# Patient Record
Sex: Male | Born: 1975
Health system: Southern US, Community
[De-identification: ages and names within clinical notes are randomized; demographics above are authoritative.]

## PROBLEM LIST (undated history)

## (undated) DIAGNOSIS — M549 Dorsalgia, unspecified: Secondary | ICD-10-CM

---

## 2000-05-27 ENCOUNTER — Encounter: Payer: Self-pay | Admitting: Emergency Medicine

## 2000-05-27 ENCOUNTER — Emergency Department (HOSPITAL_COMMUNITY): Admission: EM | Admit: 2000-05-27 | Discharge: 2000-05-27 | Payer: Self-pay | Admitting: Emergency Medicine

## 2001-05-07 ENCOUNTER — Emergency Department (HOSPITAL_COMMUNITY): Admission: EM | Admit: 2001-05-07 | Discharge: 2001-05-07 | Payer: Self-pay | Admitting: Emergency Medicine

## 2003-03-16 ENCOUNTER — Emergency Department (HOSPITAL_COMMUNITY): Admission: EM | Admit: 2003-03-16 | Discharge: 2003-03-16 | Payer: Self-pay | Admitting: *Deleted

## 2003-12-02 ENCOUNTER — Emergency Department (HOSPITAL_COMMUNITY): Admission: EM | Admit: 2003-12-02 | Discharge: 2003-12-02 | Payer: Self-pay | Admitting: Family Medicine

## 2006-03-21 ENCOUNTER — Emergency Department (HOSPITAL_COMMUNITY): Admission: EM | Admit: 2006-03-21 | Discharge: 2006-03-21 | Payer: Self-pay | Admitting: Emergency Medicine

## 2006-09-28 ENCOUNTER — Emergency Department (HOSPITAL_COMMUNITY): Admission: EM | Admit: 2006-09-28 | Discharge: 2006-09-28 | Payer: Self-pay | Admitting: Emergency Medicine

## 2007-06-01 ENCOUNTER — Emergency Department (HOSPITAL_COMMUNITY): Admission: EM | Admit: 2007-06-01 | Discharge: 2007-06-01 | Payer: Self-pay | Admitting: Emergency Medicine

## 2007-08-14 ENCOUNTER — Emergency Department (HOSPITAL_COMMUNITY): Admission: EM | Admit: 2007-08-14 | Discharge: 2007-08-14 | Payer: Self-pay | Admitting: Emergency Medicine

## 2008-02-21 ENCOUNTER — Emergency Department (HOSPITAL_COMMUNITY): Admission: EM | Admit: 2008-02-21 | Discharge: 2008-02-21 | Payer: Self-pay | Admitting: Emergency Medicine

## 2008-05-01 ENCOUNTER — Emergency Department (HOSPITAL_COMMUNITY): Admission: EM | Admit: 2008-05-01 | Discharge: 2008-05-01 | Payer: Self-pay | Admitting: Family Medicine

## 2008-05-30 ENCOUNTER — Emergency Department (HOSPITAL_COMMUNITY): Admission: EM | Admit: 2008-05-30 | Discharge: 2008-05-30 | Payer: Self-pay | Admitting: Emergency Medicine

## 2008-10-15 ENCOUNTER — Emergency Department (HOSPITAL_COMMUNITY): Admission: EM | Admit: 2008-10-15 | Discharge: 2008-10-15 | Payer: Self-pay | Admitting: Emergency Medicine

## 2008-11-19 ENCOUNTER — Emergency Department (HOSPITAL_COMMUNITY): Admission: EM | Admit: 2008-11-19 | Discharge: 2008-11-19 | Payer: Self-pay | Admitting: Emergency Medicine

## 2009-09-06 ENCOUNTER — Emergency Department (HOSPITAL_COMMUNITY): Admission: EM | Admit: 2009-09-06 | Discharge: 2009-09-06 | Payer: Self-pay | Admitting: Emergency Medicine

## 2010-07-19 LAB — CULTURE, ROUTINE-ABSCESS

## 2014-10-16 ENCOUNTER — Encounter (HOSPITAL_COMMUNITY): Payer: Self-pay | Admitting: Emergency Medicine

## 2014-10-16 ENCOUNTER — Emergency Department (HOSPITAL_COMMUNITY)
Admission: EM | Admit: 2014-10-16 | Discharge: 2014-10-16 | Disposition: A | Discharge status: Home / Self Care | Payer: Self-pay | Attending: Emergency Medicine | Admitting: Emergency Medicine

## 2014-10-16 DIAGNOSIS — K029 Dental caries, unspecified: Secondary | ICD-10-CM | POA: Insufficient documentation

## 2014-10-16 DIAGNOSIS — K047 Periapical abscess without sinus: Secondary | ICD-10-CM | POA: Insufficient documentation

## 2014-10-16 DIAGNOSIS — Z720 Tobacco use: Secondary | ICD-10-CM | POA: Insufficient documentation

## 2014-10-16 MED ORDER — PENICILLIN V POTASSIUM 500 MG PO TABS: 500.0000 mg | ORAL_TABLET | Freq: Four times a day (QID) | ORAL | Status: DC

## 2014-10-16 MED ORDER — HYDROCODONE-ACETAMINOPHEN 5-325 MG PO TABS: 1.0000 | ORAL_TABLET | ORAL | Status: DC | PRN

## 2014-10-16 MED ORDER — HYDROCODONE-ACETAMINOPHEN 5-325 MG PO TABS
2.0000 | ORAL_TABLET | Freq: Once | ORAL | Status: AC
Start: 2014-10-16 — End: 2014-10-16
  Administered 2014-10-16: 2 via ORAL
  Filled 2014-10-16: qty 2

## 2014-10-16 MED ORDER — PENICILLIN V POTASSIUM 500 MG PO TABS
500.0000 mg | ORAL_TABLET | Freq: Once | ORAL | Status: AC
  Administered 2014-10-16: 500 mg via ORAL
  Filled 2014-10-16: qty 1

## 2014-10-16 NOTE — Discharge Instructions (Signed)
Take the prescribed medication as directed. °Follow-up with dentist.  referral and resource guide provided. °Return to the ED for new or worsening symptoms. ° ° °Emergency Department Resource Guide °1) Find a Doctor and Pay Out of Pocket °Although you won't have to find out who is covered by your insurance plan, it is a good idea to ask around and get recommendations. You will then need to call the office and see if the doctor you have chosen will accept you as a new patient and what types of options they offer for patients who are self-pay. Some doctors offer discounts or will set up payment plans for their patients who do not have insurance, but you will need to ask so you aren't surprised when you get to your appointment. ° °2) Contact Your Local Health Department °Not all health departments have doctors that can see patients for sick visits, but many do, so it is worth a call to see if yours does. If you don't know where your local health department is, you can check in your phone book. The CDC also has a tool to help you locate your state's health department, and many state websites also have listings of all of their local health departments. ° °3) Find a Walk-in Clinic °If your illness is not likely to be very severe or complicated, you may want to try a walk in clinic. These are popping up all over the country in pharmacies, drugstores, and shopping centers. They're usually staffed by nurse practitioners or physician assistants that have been trained to treat common illnesses and complaints. They're usually fairly quick and inexpensive. However, if you have serious medical issues or chronic medical problems, these are probably not your best option. ° °No Primary Care Doctor: °- Call Health Connect at  832-8000 - they can help you locate a primary care doctor that  accepts your insurance, provides certain services, etc. °- Physician Referral Service- 1-800-533-3463 ° °Chronic Pain Problems: °Organization          Address  Phone   Notes  °[facility]  (336) 297-2271 Patients need to be referred by their primary care doctor.  ° °Medication Assistance: °Organization         Address  Phone   Notes  °[place] [facility] [address] °[place], [place] [address] (336) 641-8030 --Must be a resident of [place] °-- Must have NO insurance coverage whatsoever (no Medicaid/ Medicare, etc.) °-- The pt. MUST have a primary care doctor that directs their care regularly and follows them in the community °  °[facility]  (866) 331-1348   °[employer]  (888) 892-1162   ° °Agencies that provide inexpensive medical care: °Organization         Address  Phone   Notes  °[facility]  (336) 832-8035   °[facility]    (336) 832-7272   °[facility] [address] °[place], [place] [address] (336) 832-4777   °[facility] [address], °[place] (336) 271-4999   °[facility]    (336) 373-0678   °[facility]    (336) 272-1050   °[facility] ° [address], [place] Phone:  (336) 832-4444, Fax:  (336) 832-4440 Hours of Operation:  9 am - 6 pm, M-F.  Also accepts Medicaid/Medicare and self-pay.  °[facility] ° [address], [place] Phone: (  336) 832-3150, Fax: (336) 832-3151. Hours of Operation:  8:30 am - 5:30 pm, M-F.  Also accepts Medicaid and self-pay.  °[facility] [place] [address], [place] Phone: (336) 878-6027   °[facility] [address], [place], [place] (336)723-1848, Ext. 123 Mondays & Thursdays: 7-9 AM.  First 15 patients are seen on a first come, first serve basis. °  ° °Medicaid-accepting [place] Providers: ° °Organization         Address  Phone   Notes  °[facility] [address], [place] (336) 641-2100 Also accepts self-pay patients.    °[facility] [address], [place] ° (336) 856-9996   °[facility] [address], [place] (336) 288-8857   °[facility] [address] [place] [address], [place] (336) 299-7000   °Veita Bland [address], [place]  ° (336) 373-1557 Only accepts [place] Access Medicaid patients after they have their name applied to their card.  ° °Self-Pay (no insurance) in [place]: ° °Organization         Address  Phone   Notes  °Sickle Cell Patients, [facility] [address], [place] (336) 832-1970   °[facility] [address], [place] (336) 832-4400   °[facility] ° [address], [place] (336) 992-4800   °[facility]/Dr. Osei-Bonsu ° [address] [place] [address], [place] or [address], [place] (336) [phone] Phone number for both [place] and [place] locations is the same.  °[facility] [address], [place] (336) 299-0000   °[facility] [address] [place] [address], [place] or [address] (336) 852-7530 °(336) 878-2260   °[facility] [address], [place] (336) 350-1642, phone; (336) 294-5005, fax Sees patients 1st and 3rd Saturday of every month.  Must not qualify for public or private insurance (i.e. Medicaid, Medicare, [place] Health Choice, Veterans' Benefits) • Household income should be no more than 200% of the poverty level •The clinic cannot treat you if you are pregnant or think you are pregnant • Sexually transmitted diseases are not treated at the clinic.  ° ° °Dental Care: °Organization         Address  Phone  Notes  °[place] [facility] [address], [place] (336) 641-6152 Accepts children up to age 21 who are enrolled in Medicaid or [place] Health Choice; pregnant women with a Medicaid  card; and children who have applied for Medicaid or [place] Health Choice, but were declined, whose parents can pay a reduced fee at time of service.  °[place] [facility] [place]  [address], [place] (336) 641-7733 Accepts children up to age 21 who are enrolled in Medicaid or [place] Health Choice; pregnant women with a Medicaid card; and children who have applied for Medicaid or [place] Health Choice, but were declined, whose parents can pay a reduced fee at time of service.  °[facility] ° [address], [place] (336) 641-4533 Patients are seen by appointment only. Walk-ins are not accepted. [facility] will see patients 18 years of age and older. °Monday - Tuesday (8am-5pm) °Most Wednesdays (8:30-5pm) °$30 per visit, cash only  °[facility] ° [address]   [place] [phone]) [phone] Patients are seen by appointment only. Walk-ins are not accepted. [facility] will see patients 37 years of age and older. One Wednesday Evening (Monthly: Volunteer Based).  $30 per visit, cash only  [facility]  (507)[phone] for adults; Children under age 364, call Graduate Pediatric Dentistry at ([phone]) [phone]. Children aged 134-14, please call [phone] to request a pediatric application.  Dental services are provided in all areas of dental care including fillings, crowns and bridges, complete and partial dentures, implants, gum treatment, root canals, and extractions. Preventive care is also provided. Treatment is provided to both adults and children. Patients are selected via a lottery and there is often a waiting list.   [facility] [address], [place]  [phone]) [phone] [email or web address]   [facility] [address], [place], [place] [phone])([phone]) [phone], Ext. 123 Second and Fourth Thursday of each month, opens at 6:30 AM; Clinic ends at 9 AM.  Patients are seen on a first-come  first-served basis, and a limited number are seen during each clinic.   [facility]  [address], [place], [place] [phone]) [phone]   Eligibility Requirements You must have lived in [place], [place], or [place] for at least the last three months.   You cannot be eligible for state or federal sponsored [place]healthcare insurance, including CIG[employer]Veterans Administration, [place]icaid, or [employer]icare.   You generally cannot be eligible for healthcare insurance through your employer.    How to apply: Eligibility screenings are held every Tuesday and Wednesday afternoon from 1:00 pm until 4:00 pm. You do not need an appointment for the interview!  [facility] [address], [place], [place] [phone]   [facility]  [phone]   [facility]  [phone]   [facility]  ([phone]    Behavioral Health Resources in the Community: Intensive Outpatient Programs Organization         Address  Phone  Notes  [facility] [address], [place], [place] [phone]   [facility] [address], [place], [place] [phone]   [facility] [address], [place], [place]  [phone]   [facility] [address],  [place], [place] [phone] or [phone]   Substance Abuse Resources Organization         Address  Phone  Notes  [facility]  [phone]   [facility]  ([phone]   [facility]  [phone]   [facility]  [phone]   Residential & Outpatient Substance Abuse Program  [phone]   Psychological Services Organization         Address  Phone  Notes  [facility]  [phone]) [phone]- [phone]   [facility]  [phone]- [phone]   [facility] [address], [place] [phone] or [phone]    Mobile Crisis Teams Organization          Address  Phone  Notes  [facility], Mobile Crisis Care Unit  [phone]   [facility]  [address] [place], [place] [phone]   Doristine LocksSharon DeEsch [address] [place] [phone]    Self-Help/Support Groups Organization         Address  Phone             Notes  [facility] [place] - variety of support  groups  [phone]- [phone] Call for more information  Narcotics Anonymous (NA), [facility] [address], [employer] [place]  2 meetings at this location   Residential Facilities manager         Address  Phone  Notes  [facility] [place],    [place]  [phone]   [facility]  [address], [place] [id number], [place], [place]   [place], [place] [phone] Admissions: 8am-3pm M-F  [facility]cent[facility] Substance [place] [address]    [place], [place] [phone]   [facility] [address] [place], [place], [place]   [facility] [address]  [place], [place]   [facility] - Intensive Outpatient [address], [place], [place]   [facility] [address]  [place], [place] [phone]-([phone] or [phone]   [facility] ([facility] [address] [place], [place] Accepts Medicaid  [facility] [place] [address]  [place] [phone] Substance Abuse/Addiction Treatment   [facility]         Address  Phone  Notes  [facility]  [phone]   Domenic Schwab, PhD [address]lis [place], [place]   [phone] or [phone]   [facility] [place], [place] [phone]   [facility] [address] [place], [place] [phone] Insurance/Medicaid/sponsorship  through [facility] and Families [address]                                    [place], [place] [phone] [place] [address], [place] [phone]    Dr. Adele Schilder  [phone]   [facility] Dept. 1) [address] [place] 2) [place] 3)  [address], [place] [phone]  [phone]   [place] [phone] or [phone] (After Hours)

## 2014-10-16 NOTE — ED Provider Notes (Signed)
CSN: [id number]     Arrival date & time 10/16/14  0846 History   First MD Initiated Contact with Patient 10/16/14 [phone]     Chief Complaint  Patient presents with  . Dental Pain     (Consider location/radiation/quality/duration/timing/severity/associated sxs/prior Treatment) Patient is a 39 y.o. male presenting with tooth pain. The history is provided by the patient and medical records.  Dental Pain  This is a 39 year old male with no significant past medical history presenting to the ED for right lower dental pain. Patient reports pain began 4 days ago, progressively worsening. He states he has some mild right-sided jaw swelling last night, worsened upon waking this morning. He denies any difficulty swallowing or shortness of breath. He does endorse subjective fever, no chills or sweats.  No intervention tried PTA.  VSS.  History reviewed. No pertinent past medical history. History reviewed. No pertinent past surgical history. No family history on file. History  Substance Use Topics  . Smoking status: Current Every Day Smoker  . Smokeless tobacco: Not on file  . Alcohol Use: Yes    Review of Systems  HENT: Positive for dental problem.   All other systems reviewed and are negative.     Allergies  Review of patient's allergies indicates not on file.  Home Medications   Prior to Admission medications   Not on File   BP 128/94 mmHg  Pulse 73  Temp(Src) 98.6 F (37 C) (Oral)  Resp 16  SpO2 100%   Physical Exam  Constitutional: He is oriented to person, place, and time. He appears well-developed and well-nourished. No distress.  HENT:  Head: Normocephalic and atraumatic.  Mouth/Throat: Uvula is midline, oropharynx is clear and moist and mucous membranes are normal. Abnormal dentition. Dental abscesses and dental caries present. No oropharyngeal exudate, posterior oropharyngeal edema, posterior oropharyngeal erythema or tonsillar abscesses.  Teeth largely in fair  dentition, right lower premolar broken and flush with gum-line, surrounding gingiva swollen without definitive fluid collection, handling secretions appropriately, no trismus; mild swelling of right lower cheek without extension into neck; normal phonation, no stridor  Eyes: Conjunctivae and EOM are normal. Pupils are equal, round, and reactive to light.  Neck: Normal range of motion. Neck supple.  Cardiovascular: Normal rate, regular rhythm and normal heart sounds.   Pulmonary/Chest: Effort normal and breath sounds normal. No respiratory distress. He has no wheezes.  Abdominal: Soft. Bowel sounds are normal. There is no tenderness. There is no guarding.  Musculoskeletal: Normal range of motion.  Neurological: He is alert and oriented to person, place, and time.  Skin: Skin is warm and dry. He is not diaphoretic.  Psychiatric: He has a normal mood and affect.  Nursing note and vitals reviewed.   ED Course  Procedures (including critical care time) Labs Review Labs Reviewed - No data to display  Imaging Review No results found.   EKG Interpretation None      MDM   Final diagnoses:  Dental abscess   39 year old male here with right lower dental pain. Patient afebrile, nontoxic. He has an apparent abscess of his right lower premolar, mild swelling of right cheek without extension into neck. He is handling secretions well, normal phonation without stridor.  Doubt ludwig's angina.  Will start on abx and pain meds. Patient given dental follow-up and resource guide.  Discussed plan with patient, he/she acknowledged understanding and agreed with plan of care.  Return precautions given for new or worsening symptoms.  Garlon Hatchet, PA-C 10/16/14 1031  Tilden FossaElizabeth Rees, MD 10/17/14 [phone]

## 2014-10-16 NOTE — ED Notes (Signed)
Per pt, states right lower dental pain since Monday-now having swelling

## 2015-04-02 ENCOUNTER — Encounter (HOSPITAL_COMMUNITY): Payer: Self-pay | Admitting: Emergency Medicine

## 2015-04-02 ENCOUNTER — Emergency Department (HOSPITAL_COMMUNITY)
Admission: EM | Admit: 2015-04-02 | Discharge: 2015-04-02 | Disposition: A | Discharge status: Home / Self Care | Payer: Self-pay | Attending: Emergency Medicine | Admitting: Emergency Medicine

## 2015-04-02 DIAGNOSIS — F17200 Nicotine dependence, unspecified, uncomplicated: Secondary | ICD-10-CM | POA: Insufficient documentation

## 2015-04-02 DIAGNOSIS — Z792 Long term (current) use of antibiotics: Secondary | ICD-10-CM | POA: Insufficient documentation

## 2015-04-02 DIAGNOSIS — S39012A Strain of muscle, fascia and tendon of lower back, initial encounter: Secondary | ICD-10-CM | POA: Insufficient documentation

## 2015-04-02 DIAGNOSIS — X500XXA Overexertion from strenuous movement or load, initial encounter: Secondary | ICD-10-CM | POA: Insufficient documentation

## 2015-04-02 DIAGNOSIS — Y9389 Activity, other specified: Secondary | ICD-10-CM | POA: Insufficient documentation

## 2015-04-02 DIAGNOSIS — Y998 Other external cause status: Secondary | ICD-10-CM | POA: Insufficient documentation

## 2015-04-02 DIAGNOSIS — Y9289 Other specified places as the place of occurrence of the external cause: Secondary | ICD-10-CM | POA: Insufficient documentation

## 2015-04-02 MED ORDER — OXYCODONE-ACETAMINOPHEN 5-325 MG PO TABS
1.0000 | ORAL_TABLET | Freq: Once | ORAL | Status: AC
  Administered 2015-04-02: 1 via ORAL
  Filled 2015-04-02: qty 1

## 2015-04-02 MED ORDER — NAPROXEN 500 MG PO TABS: 500.0000 mg | ORAL_TABLET | Freq: Two times a day (BID) | ORAL | Status: DC

## 2015-04-02 MED ORDER — KETOROLAC TROMETHAMINE 60 MG/2ML IM SOLN
60.0000 mg | Freq: Once | INTRAMUSCULAR | Status: AC
  Administered 2015-04-02: 60 mg via INTRAMUSCULAR
  Filled 2015-04-02: qty 2

## 2015-04-02 MED ORDER — TRAMADOL HCL 50 MG PO TABS: 50.0000 mg | ORAL_TABLET | Freq: Four times a day (QID) | ORAL | Status: DC | PRN

## 2015-04-02 MED ORDER — METHOCARBAMOL 500 MG PO TABS: 500.0000 mg | ORAL_TABLET | Freq: Two times a day (BID) | ORAL | Status: DC

## 2015-04-02 NOTE — Discharge Instructions (Signed)
Naprosyn for pain and inflammation. Tramadol for severe pain. Robaxin for spasms. Rest. Try heating pads several times a day. Avoid heavy lifting. Follow up with a primary care doctor.   Lumbosacral Strain Lumbosacral strain is a strain of any of the parts that make up your lumbosacral vertebrae. Your lumbosacral vertebrae are the bones that make up the lower third of your backbone. Your lumbosacral vertebrae are held together by muscles and tough, fibrous tissue (ligaments).  CAUSES  A sudden blow to your back can cause lumbosacral strain. Also, anything that causes an excessive stretch of the muscles in the low back can cause this strain. This is typically seen when people exert themselves strenuously, fall, lift heavy objects, bend, or crouch repeatedly. RISK FACTORS  Physically demanding work.  Participation in pushing or pulling sports or sports that require a sudden twist of the back (tennis, golf, baseball).  Weight lifting.  Excessive lower back curvature.  Forward-tilted pelvis.  Weak back or abdominal muscles or both.  Tight hamstrings. SIGNS AND SYMPTOMS  Lumbosacral strain may cause pain in the area of your injury or pain that moves (radiates) down your leg.  DIAGNOSIS Your health care provider can often diagnose lumbosacral strain through a physical exam. In some cases, you may need tests such as X-ray exams.  TREATMENT  Treatment for your lower back injury depends on many factors that your clinician will have to evaluate. However, most treatment will include the use of anti-inflammatory medicines. HOME CARE INSTRUCTIONS   Avoid hard physical activities (tennis, racquetball, waterskiing) if you are not in proper physical condition for it. This may aggravate or create problems.  If you have a back problem, avoid sports requiring sudden body movements. Swimming and walking are generally safer activities.  Maintain good posture.  Maintain a healthy weight.  For acute  conditions, you may put ice on the injured area.  Put ice in a plastic bag.  Place a towel between your skin and the bag.  Leave the ice on for 20 minutes, 2-3 times a day.  When the low back starts healing, stretching and strengthening exercises may be recommended. SEEK MEDICAL CARE IF:  Your back pain is getting worse.  You experience severe back pain not relieved with medicines. SEEK IMMEDIATE MEDICAL CARE IF:   You have numbness, tingling, weakness, or problems with the use of your arms or legs.  There is a change in bowel or bladder control.  You have increasing pain in any area of the body, including your belly (abdomen).  You notice shortness of breath, dizziness, or feel faint.  You feel sick to your stomach (nauseous), are throwing up (vomiting), or become sweaty.  You notice discoloration of your toes or legs, or your feet get very cold. MAKE SURE YOU:   Understand these instructions.  Will watch your condition.  Will get help right away if you are not doing well or get worse.   This information is not intended to replace advice given to you by your health care provider. Make sure you discuss any questions you have with your health care provider.   Document Released: 12/28/2004 Document Revised: 04/10/2014 Document Reviewed: 11/06/2012 Elsevier Interactive Patient Education 2016 [employer].   Emergency Department Resource Guide 1) Find a Doctor and Pay Out of Pocket Although you won't have to find out who is covered by your insurance plan, it is a good idea to ask around and get recommendations. You will then need to call the office and  see if the doctor you have chosen will accept you as a new patient and what types of options they offer for patients who are self-pay. Some doctors offer discounts or will set up payment plans for their patients who do not have insurance, but you will need to ask so you aren't surprised when you get to your appointment.  2)  Contact Your Local Health Department Not all health departments have doctors that can see patients for sick visits, but many do, so it is worth a call to see if yours does. If you don't know where your local health department is, you can check in your phone book. The CDC also has a tool to help you locate your state's health department, and many state websites also have listings of all of their local health departments.  3) Find a Walk-in Clinic If your illness is not likely to be very severe or complicated, you may want to try a walk in clinic. These are popping up all over the country in pharmacies, drugstores, and shopping centers. They're usually staffed by nurse practitioners or physician assistants that have been trained to treat common illnesses and complaints. They're usually fairly quick and inexpensive. However, if you have serious medical issues or chronic medical problems, these are probably not your best option.  No Primary Care Doctor: - Call Health Connect at  [phone] - they can help you locate a primary care doctor that  accepts your insurance, provides certain services, etc. - Physician Referral Service- [phone]  Chronic Pain Problems: Organization         Address  Phone   Notes  [facility]  [phone] Patients need to be referred by their primary care doctor.   Medication Assistance: Organization         Address  Phone   Notes  [facility] [facility] [address] [place], [place] [address] [phone] --Must be a resident of [facility] -- Must have NO insurance coverage whatsoever (no Medicaid/ Medicare, etc.) -- The pt. MUST have a primary care doctor that directs their care regularly and follows them in the community   MedAssist  [phone]   [employer]  [phone]    Agencies that provide inexpensive medical care: Organization         Address  Phone   Notes  [facility]   [phone]   [facility]    [phone]   [facility] [address] [place], [place] [address] [phone]   [facility] [place] [address], [place] [phone]   [facility]anned Parenthood    [phone]   [facility]    [phone]) [phone]   [facility]  [address], [place] Phone:  [phone]) [phone], Fax:  [phone] Hours of Operation:  9 am - 6 pm, M-F.  Also accepts Medicaid/Medicare and self-pay.  [facility]  [address], [place] Phone: [phone], Fax: [phone]. Hours of Operation:  8:30 am - 5:30 pm, M-F.  Also accepts Medicaid and self-pay.  [facility] [place] [address], [place] Phone: [phone]   [facility] [address], [place], [place] [phone], Ext. [phone] Mondays & Thursdays: 7-9 AM.  First 15 patients are seen on a first come, first serve basis.    Medicaid-accepting [facility]  Providers:  Organization         Address  Phone   Notes  [facility] [address], [place] [phone] Also accepts self-pay patients.  [facility] [address] [place], [place]  [phone]   [facility] [address], [place] [phone]   [facility] [address] [place] [address], [place] [phone]   Renaye RakersVeita Bland [address], [place]   [phone] Only accepts [place] Access [place] patients after they have their name applied to their card.   Self-Pay (no insurance) in [facility] [place]:  Organization         Address  Phone   Notes  Sickle Cell Patients, [facility] [address], [place] [phone]) [phone]   [facility] [address], [place] [phone]   [facility] [place]  [address], [place] [phone]   [facility]/Dr. Osei-Bonsu  [address] [place] [address], [place] or [phone], [place] [phone] Phone number for both [place] and [place] locations is the same.  [facility] [address], [place] [phone]   [facility] [address] [place] [address], [place] or [address] [phone]   [facility] [address], [place] [phone], phone; [phone], fax Sees patients 1st and 3rd Saturday of every month.  Must not qualify for public or private insurance (i.e. Medicaid, Medicare, No Name Health Choice, Veterans' Benefits)  Household income should be no more than 200% of the poverty level The clinic cannot treat you if you are pregnant or think you are pregnant  Sexually transmitted diseases are not treated at the clinic.    Dental Care: Organization         Address  Phone  Notes  [facility] [address], [place] [phone] Accepts children up to age 37 who are enrolled in [place] or [place] Health Choice; pregnant women with a Medicaid card; and children who have applied for Medicaid or [place] Health Choice, but were declined, whose parents can pay a reduced fee at time of service.  [facility] [place]  [address], [place] [phone] Accepts children up to age 37 who are enrolled in [place] or [place] Health Choice; pregnant women with a Medicaid card; and children who have applied for Medicaid or [place] Health Choice, but were declined, whose parents can pay a reduced fee at time of service.  [facility]  [address], [place] [phone] Patients are seen by appointment only. Walk-ins are not accepted. [facility] will see patients 37 years of age and older. Monday - Tuesday  (8am-5pm) Most Wednesdays (8:30-5pm) $30 per visit, cash only  [facility]  [address], [place] [facility] [place] [phone] Patients are seen by appointment only. Walk-ins are not accepted. [facility] will see patients 37 years of age and older. One Wednesday Evening (Monthly: Volunteer Based).  $30 per visit, cash only  [facility]  [phone] for adults; Children under age 834, call Graduate Pediatric Dentistry at [phone]. Children aged 684-14, please call [phone] to request a pediatric application.  Dental services are provided in all areas of dental care including fillings, crowns and bridges, complete and partial dentures, implants, gum treatment, root canals, and extractions. Preventive care is also provided. Treatment is provided to both adults and children. Patients are selected via a lottery and there is often a waiting list.   [facility] [address], [place]  [phone] [email or web address]   [facility] [address] [place], [place] [phone], Ext. [phone] Second and Fourth Thursday of each month, opens at 6:30 AM; Clinic ends at 9 AM.  Patients are seen on a first-come first-served basis, and a limited number are seen during each clinic.   [facility]  [address] Ether Griffins [place], [place] [phone]   Eligibility Requirements You must have lived in [place], [place], or [place] counties for at least the last three months.   You cannot be eligible for state or federal sponsored [place], including [employer], [place], or [employer].   You generally cannot be eligible for healthcare insurance through your employer.    How to apply: Eligibility screenings are held every Tuesday and Wednesday afternoon from 1:00 pm until 4:00 pm. You do not need an appointment for the interview!  [facility] [address], [place], [place]  [phone]   [facility] Health Department  [phone]   [facility] Health Department  [phone]   [facility] Health Department  [phone]    Behavioral Health Resources in the Community: Intensive Outpatient Programs Organization         Address  Phone  Notes  [facility] [address], [place], [place] [phone]   [facility] [address], [place], [place] [phone]   [facility] [address], [place], [place]  [phone]   [facility] [address]  [place], [place] [phone] or [phone]   Substance Abuse Resources Organization         Address  Phone  Notes  Alcohol [facility] Drug Services  [phone]   [facility]  [phone]   [facility] [place]  [phone]   Floydene Flock  [phone]   Residential & Outpatient Substance Abuse Program  [phone]   Psychological Services Organization         Address  Phone  Notes  [facility]  [phone]   [facility] Services  [phone]   [facility] [address], [place] [phone] or [phone]    Mobile Crisis Teams Organization         Address  Phone  Notes  [facility], Mobile Crisis Care Unit  [phone]   [facility]  [address] [place], [place] [phone]   Doristine Locks [address] [place] [phone]    Self-Help/Support Groups Organization         Address  Phone             Notes  [facility] [place] - variety of support groups  [phone]- [phone] Call for more information  Narcotics Anonymous (NA), [facility]aring [facility] [address], [employer] [place]  2 meetings at this location   Statistician         Address  Phone  Notes  [facility] [address],    [place]  [phone]   [facility]  [address] [place] [address]892, [place], [place]  [phone]   [facility] [address] [place], [place] [phone] Admissions: 8am-3pm M-F  [facility] [address]    [place], [place] [phone]   [facility] [address] [place], [place], [place] [phone]   [facility] [address],  [place], [place] [phone]   [facility] - Intensive Outpatient [address], [place], [place] [phone]   [facility] ([facility] [address]  [place], [place] [phone] or [phone]   [facility] ([facility]) [address] [place], [place] [phone] Accepts Medicaid  [facility] [address]  [place] [phone] Substance Abuse/Addiction Treatment   [facility] Organization         Address  Phone  Notes  [facility]  [phone]   Angie Fava, PhD [address]vin Knack [place], [place]   [phone] or [phone]   [facility] Behavioral   [address] [place], [place] [phone]   [facility] [address], [place], [place] [phone] Insurance/Medicaid/sponsorship through [facility] and Families [address]                                    [place], [place] [phone] Therapy/tele-psych/case  [facility] [address] [place], [place] [phone]    Dr. Lolly Mustache  [phone]   [facility]  [employer] [facility] Dept. 1) [address] [place] 2) [address] [place] 3)  [address], [place] [phone]  [phone]   [facility] Child Abuse Hotline 845-289-5611 or [phone] (After Hours)

## 2015-04-02 NOTE — ED Provider Notes (Signed)
CSN: [id number]     Arrival date & time 04/02/15  4098 History   First MD Initiated Contact with Patient 04/02/15 1018     Chief Complaint  Patient presents with  . Back Pain     (Consider location/radiation/quality/duration/timing/severity/associated sxs/prior Treatment) HPI Tommy Abbott is a 39 y.o. male with history of reported herniated disc him presents to emergency department complaining of lower back pain. Patient states he lifted something yesterday and states since then he has had right lower back pain. Pain does not radiate into his extremities. Denies any numbness or weakness in extremities. He denies any loss of bladder or bowel control. He denies any abdominal pain. He denies any recent falls or accidents. He does not have a primary care doctor. He took Tylenol for pain which she states did not help. Patient is ambulatory.  History reviewed. No pertinent past medical history. History reviewed. No pertinent past surgical history. History reviewed. No pertinent family history. Social History  Substance Use Topics  . Smoking status: Current Every Day Smoker  . Smokeless tobacco: None  . Alcohol Use: Yes    Review of Systems  Constitutional: Negative for fever and chills.  Respiratory: Negative for cough, chest tightness and shortness of breath.   Cardiovascular: Negative for chest pain, palpitations and leg swelling.  Gastrointestinal: Negative for nausea, vomiting, abdominal pain, diarrhea and abdominal distention.  Genitourinary: Negative for dysuria, urgency, frequency and hematuria.  Musculoskeletal: Positive for back pain and arthralgias. Negative for myalgias, neck pain and neck stiffness.  Skin: Negative for rash.  Allergic/Immunologic: Negative for immunocompromised state.  Neurological: Negative for headaches.  All other systems reviewed and are negative.     Allergies  Review of patient's allergies indicates no known allergies.  Home Medications    Prior to Admission medications   Medication Sig Start Date End Date Taking? Authorizing Provider  HYDROcodone-acetaminophen (NORCO/VICODIN) 5-325 MG per tablet Take 1 tablet by mouth every 4 (four) hours as needed. 10/16/14   Garlon Hatchet, PA-C  penicillin v potassium (VEETID) 500 MG tablet Take 1 tablet (500 mg total) by mouth 4 (four) times daily. 10/16/14   Garlon Hatchet, PA-C   BP 128/85 mmHg  Pulse 71  Temp(Src) 98 F (36.7 C) (Oral)  Resp 16  SpO2 100% Physical Exam  Constitutional: He appears well-developed and well-nourished. No distress.  HENT:  Head: Normocephalic.  Neck: Normal range of motion. Neck supple.  Cardiovascular: Normal rate, regular rhythm and normal heart sounds.   Pulmonary/Chest: Effort normal and breath sounds normal. No respiratory distress. He has no wheezes. He has no rales.  Abdominal: Soft. There is no tenderness.  Musculoskeletal:  No midline thoracic or lumbar spine tenderness. Tender to palpation over right lower lumbar paraspinal muscles and right SI joint. No pain with bilateral straight leg raise.  Neurological: He is alert.  5/5 and equal lower extremity strength. 2+ and equal patellar reflexes bilaterally. Pt able to dorsiflex bilateral toes and feet with good strength against resistance. Equal sensation bilaterally over thighs and lower legs.   Skin: Skin is warm and dry.  Nursing note and vitals reviewed.   ED Course  Procedures (including critical care time) Labs Review Labs Reviewed - No data to display  Imaging Review No results found. I have personally reviewed and evaluated these images and lab results as part of my medical decision-making.   EKG Interpretation None      MDM   Final diagnoses:  Lumbosacral strain, initial encounter  patient with lower back pain after lifting heavy ish object yesterday. Pain does not radiate. No bladder or bowel issues. No numbness or weakness in extremities. No evidence of cauda  equina. The strain. We'll treat with tramadol, naproxen, Robaxin. I have ordered him Toradol and oxycodone in emergency department. Patient became upset saying that nothing works for his pain except for oxycodone. He states that is the only medicine that helps. Explained I do not think oxycodone is appropriate choice of medication given his injury, will start out with NSAIDs and tramadol for more severe pain. He will need to follow with her primary care doctor. Also advised to rest, heating pads, no heavy lifting.  Filed Vitals:   04/02/15 1013  BP: 128/85  Pulse: 71  Temp: 98 F (36.7 C)  TempSrc: Oral  Resp: 16  SpO2: 100%     Jaynie Crumbleatyana Annmarie Plemmons, PA-C 04/02/15 1328  Benjiman CoreNathan Pickering, MD 04/03/15 1513

## 2015-04-02 NOTE — ED Notes (Signed)
Pt c/o back pain onset days ago, pt has hx of herniated disk. No bowel or bladder incontinence, ambulatory.

## 2015-08-13 ENCOUNTER — Encounter (HOSPITAL_COMMUNITY): Payer: Self-pay | Admitting: Emergency Medicine

## 2015-08-13 ENCOUNTER — Emergency Department (HOSPITAL_COMMUNITY)
Admission: EM | Admit: 2015-08-13 | Discharge: 2015-08-13 | Disposition: A | Discharge status: Home / Self Care | Payer: Self-pay | Attending: Emergency Medicine | Admitting: Emergency Medicine

## 2015-08-13 DIAGNOSIS — K029 Dental caries, unspecified: Secondary | ICD-10-CM | POA: Insufficient documentation

## 2015-08-13 DIAGNOSIS — K0381 Cracked tooth: Secondary | ICD-10-CM | POA: Insufficient documentation

## 2015-08-13 DIAGNOSIS — K0889 Other specified disorders of teeth and supporting structures: Secondary | ICD-10-CM | POA: Insufficient documentation

## 2015-08-13 DIAGNOSIS — F17200 Nicotine dependence, unspecified, uncomplicated: Secondary | ICD-10-CM | POA: Insufficient documentation

## 2015-08-13 MED ORDER — TRAMADOL HCL 50 MG PO TABS
50.0000 mg | ORAL_TABLET | Freq: Once | ORAL | Status: AC
Start: 2015-08-13 — End: 2015-08-13
  Administered 2015-08-13: 50 mg via ORAL
  Filled 2015-08-13: qty 1

## 2015-08-13 MED ORDER — PENICILLIN V POTASSIUM 500 MG PO TABS
500.0000 mg | ORAL_TABLET | Freq: Once | ORAL | Status: AC
  Administered 2015-08-13: 500 mg via ORAL
  Filled 2015-08-13: qty 1

## 2015-08-13 MED ORDER — PENICILLIN V POTASSIUM 500 MG PO TABS: 500.0000 mg | ORAL_TABLET | Freq: Three times a day (TID) | ORAL | Status: DC

## 2015-08-13 MED ORDER — TRAMADOL HCL 50 MG PO TABS: 50.0000 mg | ORAL_TABLET | Freq: Four times a day (QID) | ORAL | Status: DC | PRN

## 2015-08-13 NOTE — ED Notes (Signed)
Pt is c/o dental pain on the bottom right side  sxs started 2 days ago and today he has some facial swelling noted

## 2015-08-13 NOTE — Discharge Instructions (Signed)
Dental Abscess A dental abscess is a collection of pus in or around a tooth. CAUSES This condition is caused by a bacterial infection around the root of the tooth that involves the inner part of the tooth (pulp). It may result from:  Severe tooth decay.  Trauma to the tooth that allows bacteria to enter into the pulp, such as a broken or chipped tooth.  Severe gum disease around a tooth. SYMPTOMS Symptoms of this condition include:  Severe pain in and around the infected tooth.  Swelling and redness around the infected tooth, in the mouth, or in the face.  Tenderness.  Pus drainage.  Bad breath.  Bitter taste in the mouth.  Difficulty swallowing.  Difficulty opening the mouth.  Nausea.  Vomiting.  Chills.  Swollen neck glands.  Fever. DIAGNOSIS This condition is diagnosed with examination of the infected tooth. During the exam, your dentist may tap on the infected tooth. Your dentist will also ask about your medical and dental history and may order X-rays. TREATMENT This condition is treated by eliminating the infection. This may be done with:  Antibiotic medicine.  A root canal. This may be performed to save the tooth.  Pulling (extracting) the tooth. This may also involve draining the abscess. This is done if the tooth cannot be saved. HOME CARE INSTRUCTIONS  Take medicines only as directed by your dentist.  If you were prescribed antibiotic medicine, finish all of it even if you start to feel better.  Rinse your mouth (gargle) often with salt water to relieve pain or swelling.  Do not drive or operate heavy machinery while taking pain medicine.  Do not apply heat to the outside of your mouth.  Keep all follow-up visits as directed by your dentist. This is important. SEEK MEDICAL CARE IF:  Your pain is worse and is not helped by medicine. SEEK IMMEDIATE MEDICAL CARE IF:  You have a fever or chills.  Your symptoms suddenly get worse.  You have a  very bad headache.  You have problems breathing or swallowing.  You have trouble opening your mouth.  You have swelling in your neck or around your eye.   This information is not intended to replace advice given to you by your health care provider. Make sure you discuss any questions you have with your health care provider.   Document Released: 03/20/2005 Document Revised: 08/04/2014 Document Reviewed: 03/17/2014 Elsevier Interactive Patient Education 2016 [employer].  [place] Free Dental Care Clinics (Also Low Cost And Sliding Scale) Home  [facility]  FreeDentalCare.us is a free website maintained by users like you. Our volunteers work hard to make sure the information on these clinics is up to date and accurate.   Services Listed: 1. Free Dental Clinics 2. Sliding Fee Scale Dental Clinics 3. Low Cost Affordable Dental Clinics 4. Non Profit Dental Clinics   Please be aware than not all clinics are completely free. Some cities also have a low number of clinics so in many cases we have included nearby clinics in the search results.  If you are aware of any clinics that offer free or low cost services to patients needing dental care please contact us. Also, if you are the owner of a clinic or work at a clinic that is listed on this website and wish to update our site please contact us.  The free dental care facilited listed in our [place], [place] page are mostly contributed by  users like you that help improve the content quality of this free website. If you live in [place], [place] and cannot afford dental coverage there are government and non-profit programs that cater to local residents in need. These services include: Cleanings, Checkups, Caps, Dentures, Braces.  Help [place] Help You Tell us what type of assistance you are looking for. Help For Children and Single Mothers Low Income Housing Unemployment  Radio broadcast assistantBenefits Food Stamps Assistance Medical Assistance Dental Assistance Drug and Alchohol Rehab      Top 6 clinics in or near [place] 1. [facility]   [address] [place], [place] - [phone] Clinic Full Details  [facility] Dental Clinic. [facility]. Provides exams, treatment, cleanings, and emergency care for financially eligible children. We take children up to age 38 who are enrolled in Medicaid or [place] Health Choice; pregnant women with a Medi  Clinic Full Details  2. [facility] [place]   13 miles away from [facility]  [address] [place], [place]- [phone] Clinic Full Details  Nearby Dental Clinic: 13 miles from [facility] [place] [facility]. Healthy teeth are an important part of healthy bodies. Our Dental Clinic staff provides exams, treatment, cleanings, and emergency care for financially eligible children. We take children up to age 421 who are enrolled website  Clinic Full Details  3. [facility]   21 miles away from [place]  [address] [place], [place]- [phone] Clinic Full Details  Nearby Dental Clinic: 21 miles from [place] [facility] [employer]. It is the mission of the Free Clinic to recognize the right of low income, uninsured citizens of [place] to have access to health care that compassionately meets their basic medical, dental and pharmacy needs. [facility] provides basic extractions,  website  Clinic Full Details  4. [facility] [place]   22 miles away from [facility]  [address] [place], [place]- [phone] Clinic Full Details  [facility]: 22 miles from [facility] Mission: To provide access to compassionate, high-quality healthcare services to the medically uninsured and  underserved who reside in [place], [place] or [place] counties and meet the eligibility guidelines of the [facility]. What we do: - Provide access to high-quality medical and dental website  Clinic Full Details  5. [facility]   25 miles away from [facility]  [address] [place], [place]- [phone] Clinic Full Details  Nearby Dental Clinic: 25 miles from [facility].  Clinic Full Details  6. [facility]   26 miles away from [place]  [place], [place]- [phone]  Clinic Full Details  Nearby Dental Clinic: 26 miles from [facility]   Clinic Full Details

## 2015-08-13 NOTE — ED Provider Notes (Signed)
CSN: [id number]     Arrival date & time 08/13/15  0133 History   First MD Initiated Contact with Patient 08/13/15 0257     Chief Complaint  Patient presents with  . Dental Pain     (Consider location/radiation/quality/duration/timing/severity/associated sxs/prior Treatment) HPI   Tommy Abbott 40 y.o. male   Dental Pain Onset: two days ago Location:  Bottom right tooth Quality:  Sharp, throbbing Severity:  severe Timing:  acute  Progression:  worsening Chronicity:  acute Context: thinks he bit something that jabbed the area Relieved by:  Sometimes the pain stops on its own and then starts back up Worsened by: eating, touching, cold air Ineffective treatments: Advil, helped it for a little while Associated symptoms:None Risk factors: widespread dental decay  Negative ROS: Nausea, vomiting, diarrhea,  Fevers, myalgias, weakness, confusion, neck pain, rash, SOB, diaphoresis,ear pain, sore throat, trouble swallowing, drooling, difficulty opening jaw.      History reviewed. No pertinent past medical history. History reviewed. No pertinent past surgical history. History reviewed. No pertinent family history. Social History  Substance Use Topics  . Smoking status: Current Every Day Smoker  . Smokeless tobacco: None  . Alcohol Use: Yes    Review of Systems  Review of Systems All other systems negative except as documented in the HPI. All pertinent positives and negatives as reviewed in the HPI.   Allergies  Review of patient's allergies indicates no known allergies.  Home Medications   Prior to Admission medications   Medication Sig Start Date End Date Taking? Authorizing Provider  ibuprofen (ADVIL,MOTRIN) 200 MG tablet Take 200 mg by mouth every 6 (six) hours as needed for moderate pain.   Yes Historical Provider, MD  methocarbamol (ROBAXIN) 500 MG tablet Take 1 tablet (500 mg total) by mouth 2 (two) times daily. Patient not taking: Reported on 08/13/2015 04/02/15    Tatyana Kirichenko, PA-C  naproxen (NAPROSYN) 500 MG tablet Take 1 tablet (500 mg total) by mouth 2 (two) times daily. Patient not taking: Reported on 08/13/2015 04/02/15   Tatyana Kirichenko, PA-C  penicillin v potassium (VEETID) 500 MG tablet Take 1 tablet (500 mg total) by mouth 3 (three) times daily. 08/13/15   Raevyn Sokol Neva SeatGreene, PA-C  traMADol (ULTRAM) 50 MG tablet Take 1 tablet (50 mg total) by mouth every 6 (six) hours as needed. Patient not taking: Reported on 08/13/2015 04/02/15   Tatyana Kirichenko, PA-C  traMADol (ULTRAM) 50 MG tablet Take 1 tablet (50 mg total) by mouth every 6 (six) hours as needed. 08/13/15   Benicia Bergevin Neva SeatGreene, PA-C   BP 161/102 mmHg  Pulse 57  Temp(Src) 98.8 F (37.1 C) (Oral)  Resp 16  SpO2 99% Physical Exam  Constitutional: He appears well-developed and well-nourished.  HENT:  Head: Normocephalic and atraumatic.  Mouth/Throat: Oropharynx is clear and moist. Dental caries present.    Wide spread dental decay. Multiple broken teeth. No obvious abscess or trismus. No drooling, small amount of associated swelling to right cheek,  Eyes: Conjunctivae and EOM are normal. Pupils are equal, round, and reactive to light.  Neck: Normal range of motion. Neck supple.  Cardiovascular: Normal rate and regular rhythm.   Pulmonary/Chest: Effort normal and breath sounds normal.  Nursing note and vitals reviewed.   ED Course  Procedures (including critical care time) Labs Review Labs Reviewed - No data to display  Imaging Review No results found. I have personally reviewed and evaluated these images and lab results as part of my medical decision-making.   EKG Interpretation  None      MDM   Final diagnoses:  Toothache    Medications  penicillin v potassium (VEETID) tablet 500 mg (500 mg Oral Given 08/13/15 0404)  traMADol (ULTRAM) tablet 50 mg (50 mg Oral Given 08/13/15 0405)    39 y.o.Tommy Abbott's evaluation in the Emergency Department is complete.  We  have discussed signs and symptoms that warrant return to the ED, such as changes or worsening in symptoms. No emergent s/sx's present. Patent airway. No trismus.  No neck tenderness or protrusion of tongue or floor of mouth. Patient will be given an rx for Penicillin and Ultram. He will be referred to a dentist with instructions for follow-up.  Vital signs are stable at discharge. Filed Vitals:   08/13/15 0145  BP: 161/102  Pulse: 57  Temp: 98.8 F (37.1 C)  Resp: 16    Patient/guardian has voiced understanding and agreed to follow-up with the PCP or specialist.      Marlon Pel, PA-C 08/13/15 0434  Dione Booze, MD 08/13/15 [phone]

## 2018-12-26 ENCOUNTER — Ambulatory Visit
Admission: EM | Admit: 2018-12-26 | Discharge: 2018-12-26 | Disposition: A | Discharge status: Home / Self Care | Payer: 59 | Attending: Physician Assistant | Admitting: Physician Assistant

## 2018-12-26 DIAGNOSIS — M545 Low back pain, unspecified: Secondary | ICD-10-CM

## 2018-12-26 MED ORDER — MELOXICAM 7.5 MG PO TABS: 7.5000 mg | ORAL_TABLET | Freq: Every day | ORAL | 0 refills | Status: DC

## 2018-12-26 MED ORDER — METHOCARBAMOL 500 MG PO TABS: 500.0000 mg | ORAL_TABLET | Freq: Two times a day (BID) | ORAL | 0 refills | Status: DC

## 2018-12-26 NOTE — ED Triage Notes (Signed)
Pt c/o lower back x 2 days, denies any new injury

## 2018-12-26 NOTE — ED Provider Notes (Signed)
[name]    CSN: [id number] Arrival date & time: 12/26/18  1214      History   Chief Complaint Chief Complaint  Patient presents with  . Back Pain    HPI Tommy Abbott is a 43 y.o. male.   43 year old male comes in for 2-day history of low back pain.  Has a history of herniated disc and has intermittent flareups of back pain.  He denies any injury/trauma.  Back pain is to the lumbar region, bilateral, constant, worse with movement.  Denies radiation of pain.  Denies saddle anesthesia, loss of bladder or bowel control.  He feels as if the area is "swollen".  Denies rashes, erythema, warmth.  Denies fever, chills, body aches.  Denies urinary symptoms such as frequency, dysuria, hematuria.  Patient states he has not had any medications for the symptoms.  States he has a history of opiate abuse from right knee pain, and since then, has tried to avoid any pain medications.     History reviewed. No pertinent past medical history.  There are no active problems to display for this patient.   History reviewed. No pertinent surgical history.     Home Medications    Prior to Admission medications   Medication Sig Start Date End Date Taking? Authorizing Provider  meloxicam (MOBIC) 7.5 MG tablet Take 1 tablet (7.5 mg total) by mouth daily. 12/26/18   Cathie Hoops, Brielynn Sekula V, PA-C  methocarbamol (ROBAXIN) 500 MG tablet Take 1 tablet (500 mg total) by mouth 2 (two) times daily. 12/26/18   Belinda Fisher, PA-C    Family History No family history on file.  Social History Social History   Tobacco Use  . Smoking status: Current Every Day Smoker  . Smokeless tobacco: Never Used  Substance Use Topics  . Alcohol use: Yes  . Drug use: No     Allergies   Patient has no known allergies.   Review of Systems Review of Systems  Reason unable to perform ROS: See HPI as above.     Physical Exam Triage Vital Signs ED Triage Vitals  Enc Vitals Group     BP 12/26/18 1225 133/87    Pulse Rate 12/26/18 1225 79     Resp 12/26/18 1225 16     Temp 12/26/18 1225 98 F (36.7 C)     Temp Source 12/26/18 1225 Oral     SpO2 12/26/18 1225 97 %     Weight --      Height --      Head Circumference --      Peak Flow --      Pain Score 12/26/18 1231 8     Pain Loc --      Pain Edu? --      Excl. in GC? --    No data found.  Updated Vital Signs BP 133/87 (BP Location: Left Arm)   Pulse 79   Temp 98 F (36.7 C) (Oral)   Resp 16   SpO2 97%   Physical Exam Constitutional:      General: He is not in acute distress.    Appearance: He is well-developed. He is not diaphoretic.  HENT:     Head: Normocephalic and atraumatic.  Eyes:     Conjunctiva/sclera: Conjunctivae normal.     Pupils: Pupils are equal, round, and reactive to light.  Cardiovascular:     Rate and Rhythm: Normal rate and regular rhythm.     Heart sounds: Normal heart sounds. No  murmur. No friction rub. No gallop.   Pulmonary:     Effort: Pulmonary effort is normal. No accessory muscle usage or respiratory distress.     Breath sounds: Normal breath sounds. No stridor. No decreased breath sounds, wheezing, rhonchi or rales.  Musculoskeletal:     Comments: No tenderness on palpation of the spinous processes.  Tenderness to palpation of bilateral lower lumbar region.  No tenderness to palpation of bilateral hips.  Full range of motion of back and hips, the movement is slow. Strength normal and equal bilaterally. Sensation intact and equal bilaterally.  Negative straight leg raise  Skin:    General: Skin is warm and dry.  Neurological:     Mental Status: He is alert and oriented to person, place, and time.      UC Treatments / Results  Labs (all labs ordered are listed, but only abnormal results are displayed) Labs Reviewed - No data to display  EKG   Radiology No results found.  Procedures Procedures (including critical care time)  Medications Ordered in UC Medications - No data to display   Initial Impression / Assessment and Plan / UC Course  I have reviewed the triage vital signs and the nursing notes.  Pertinent labs & imaging results that were available during my care of the patient were reviewed by me and considered in my medical decision making (see chart for details).    Discussed use of NSAIDs, with reassurance of no narcotic component.  Discussed use of muscle relaxants, for which patient has tried in the past and is comfortable with.  Discussed rest, ice/heat compress.  Return precautions given.  Patient expresses understanding and agrees to plan.  Final Clinical Impressions(s) / UC Diagnoses   Final diagnoses:  Acute bilateral low back pain without sciatica    ED Prescriptions    Medication Sig Dispense Auth. Provider   meloxicam (MOBIC) 7.5 MG tablet Take 1 tablet (7.5 mg total) by mouth daily. 10 tablet Marbin Olshefski V, PA-C   methocarbamol (ROBAXIN) 500 MG tablet Take 1 tablet (500 mg total) by mouth 2 (two) times daily. 20 tablet Ok Edwards, PA-C     I have reviewed the PDMP during this encounter.   Ok Edwards, PA-C 12/26/18 1444

## 2018-12-26 NOTE — Discharge Instructions (Signed)
Start Mobic. Do not take ibuprofen (motrin/advil)/ naproxen (aleve) while on mobic. Robaxin as needed, this can make you drowsy, so do not take if you are going to drive, operate heavy machinery, or make important decisions. Ice/heat compresses as needed. This can take up to 3-4 weeks to completely resolve, but you should be feeling better each week. Follow up here or with PCP if symptoms worsen, changes for reevaluation. If experience numbness/tingling of the inner thighs, loss of bladder or bowel control, go to the emergency department for evaluation.  ° °

## 2018-12-27 ENCOUNTER — Telehealth: Payer: Self-pay | Admitting: Emergency Medicine

## 2018-12-27 NOTE — Telephone Encounter (Signed)
Checked in on patient, discussed medications, and encouraged return call with any continuing questions or concerns.    

## 2019-01-06 ENCOUNTER — Ambulatory Visit
Admission: EM | Admit: 2019-01-06 | Discharge: 2019-01-06 | Disposition: A | Discharge status: Home / Self Care | Payer: 59 | Attending: Emergency Medicine | Admitting: Emergency Medicine

## 2019-01-06 DIAGNOSIS — F17200 Nicotine dependence, unspecified, uncomplicated: Secondary | ICD-10-CM

## 2019-01-06 DIAGNOSIS — Z1159 Encounter for screening for other viral diseases: Secondary | ICD-10-CM | POA: Diagnosis not present

## 2019-01-06 DIAGNOSIS — R05 Cough: Secondary | ICD-10-CM | POA: Diagnosis not present

## 2019-01-06 DIAGNOSIS — J069 Acute upper respiratory infection, unspecified: Secondary | ICD-10-CM

## 2019-01-06 MED ORDER — PREDNISONE 20 MG PO TABS: 40.0000 mg | ORAL_TABLET | Freq: Every day | ORAL | 0 refills | Status: AC

## 2019-01-06 MED ORDER — FLUTICASONE PROPIONATE 50 MCG/ACT NA SUSP: 1.0000 | Freq: Every day | NASAL | 0 refills | Status: DC

## 2019-01-06 MED ORDER — ALBUTEROL SULFATE HFA 108 (90 BASE) MCG/ACT IN AERS: 2.0000 | INHALATION_SPRAY | RESPIRATORY_TRACT | 0 refills | Status: DC | PRN

## 2019-01-06 MED ORDER — BENZONATATE 100 MG PO CAPS: 100.0000 mg | ORAL_CAPSULE | Freq: Three times a day (TID) | ORAL | 0 refills | Status: DC

## 2019-01-06 NOTE — ED Triage Notes (Signed)
Pt c/o cough, runny nose, sore throat, and abdominal cramping since Friday and Sunday was worse.

## 2019-01-06 NOTE — ED Provider Notes (Signed)
[name]    CSN: [id number] Arrival date & time: 01/06/19  1010      History   Chief Complaint Chief Complaint  Patient presents with  . Cough    HPI Tommy Abbott is a 43 y.o. male with history of current tobacco use presenting for nonproductive, non-hemoptic cough, runny nose, sore throat, generalized abdominal cramping, malaise, myalgias, subjective fever since Friday.  States symptoms were worse yesterday.  Patient works in public setting: No known COVID exposure, recent travel.  Has not tried anything for his symptoms yet.  States subjective fever resolved 2 days ago.   History reviewed. No pertinent past medical history.  There are no active problems to display for this patient.   History reviewed. No pertinent surgical history.     Home Medications    Prior to Admission medications   Medication Sig Start Date End Date Taking? Authorizing Provider  albuterol (VENTOLIN HFA) 108 (90 Base) MCG/ACT inhaler Inhale 2 puffs into the lungs every 4 (four) hours as needed for wheezing or shortness of breath. 01/06/19   Hall-Potvin, [place], PA-C  benzonatate (TESSALON) 100 MG capsule Take 1 capsule (100 mg total) by mouth every 8 (eight) hours. 01/06/19   Hall-Potvin, [place], PA-C  fluticasone (FLONASE) 50 MCG/ACT nasal spray Place 1 spray into both nostrils daily. 01/06/19   Hall-Potvin, [place], PA-C  predniSONE (DELTASONE) 20 MG tablet Take 2 tablets (40 mg total) by mouth daily for 5 days. 01/06/19 01/11/19  Hall-Potvin, [place], PA-C    Family History No family history on file.  Social History Social History   Tobacco Use  . Smoking status: Current Every Day Smoker  . Smokeless tobacco: Never Used  Substance Use Topics  . Alcohol use: Yes  . Drug use: No     Allergies   Patient has no known allergies.   Review of Systems Review of Systems  Constitutional: Positive for appetite change and fatigue. Negative for fever.  HENT: Positive for  congestion, rhinorrhea and sore throat. Negative for dental problem, ear pain, facial swelling, hearing loss, sinus pain, trouble swallowing and voice change.   Eyes: Negative for photophobia, pain and visual disturbance.  Respiratory: Positive for cough. Negative for choking, shortness of breath and wheezing.        Positive for shortness of breath when coughing  Cardiovascular: Negative for chest pain, palpitations and leg swelling.  Gastrointestinal: Positive for abdominal pain. Negative for abdominal distention, blood in stool, diarrhea, nausea and vomiting.  Musculoskeletal: Negative for arthralgias and myalgias.  Skin: Negative for rash and wound.  Neurological: Negative for dizziness, speech difficulty and headaches.  All other systems reviewed and are negative.    Physical Exam Triage Vital Signs ED Triage Vitals  Enc Vitals Group     BP 01/06/19 1022 131/83     Pulse Rate 01/06/19 1022 74     Resp 01/06/19 1022 18     Temp 01/06/19 1022 98.8 F (37.1 C)     Temp Source 01/06/19 1022 Oral     SpO2 01/06/19 1022 96 %     Weight --      Height --      Head Circumference --      Peak Flow --      Pain Score 01/06/19 1023 8     Pain Loc --      Pain Edu? --      Excl. in GC? --    No data found.  Updated Vital Signs BP  131/83 (BP Location: Left Arm)   Pulse 74   Temp 98.8 F (37.1 C) (Oral)   Resp 18   SpO2 96%   Visual Acuity Right Eye Distance:   Left Eye Distance:   Bilateral Distance:    Right Eye Near:   Left Eye Near:    Bilateral Near:     Physical Exam Constitutional:      General: He is not in acute distress.    Appearance: He is normal weight. He is not toxic-appearing.  HENT:     Head: Normocephalic and atraumatic.     Right Ear: Tympanic membrane, ear canal and external ear normal.     Left Ear: Tympanic membrane, ear canal and external ear normal.     Mouth/Throat:     Mouth: Mucous membranes are moist.     Pharynx: Oropharynx is clear.  Posterior oropharyngeal erythema present. No oropharyngeal exudate.  Eyes:     General: No scleral icterus.    Conjunctiva/sclera: Conjunctivae normal.     Pupils: Pupils are equal, round, and reactive to light.  Neck:     Musculoskeletal: Neck supple. No muscular tenderness.  Cardiovascular:     Rate and Rhythm: Normal rate and regular rhythm.     Heart sounds: No murmur. No gallop.   Pulmonary:     Effort: Pulmonary effort is normal. No respiratory distress.     Breath sounds: No stridor. Wheezing present. No rhonchi or rales.  Chest:     Chest wall: No tenderness.  Abdominal:     General: Abdomen is flat. Bowel sounds are normal.     Tenderness: There is no abdominal tenderness.  Musculoskeletal:     Right lower leg: No edema.     Left lower leg: No edema.  Lymphadenopathy:     Cervical: No cervical adenopathy.  Skin:    General: Skin is warm.     Capillary Refill: Capillary refill takes less than 2 seconds.     Coloration: Skin is not jaundiced or pale.  Neurological:     General: No focal deficit present.     Mental Status: He is alert and oriented to person, place, and time.      UC Treatments / Results  Labs (all labs ordered are listed, but only abnormal results are displayed) Labs Reviewed  NOVEL CORONAVIRUS, NAA    EKG   Radiology No results found.  Procedures Procedures (including critical care time)  Medications Ordered in UC Medications - No data to display  Initial Impression / Assessment and Plan / UC Course  I have reviewed the triage vital signs and the nursing notes.  Pertinent labs & imaging results that were available during my care of the patient were reviewed by me and considered in my medical decision making (see chart for details).     1.  Viral URI with cough Patient hemodynamically stable with O2 greater than 94%: CXR deferred due to these findings, duration of symptoms.  COVID test pending: Patient to quarantine until results are  back.  Will treat supportively, add prednisone given tobacco use, pulmonic wheezing.  Return precautions discussed, patient verbalized understanding and is agreeable to plan. Final Clinical Impressions(s) / UC Diagnoses   Final diagnoses:  Viral URI with cough     Discharge Instructions     Your COVID test is pending: Is important you quarantine at home until your results are back. You may take OTC Tylenol for fever and myalgias.  It is important to drink  plenty of water throughout the day to stay hydrated. If you test positive and later develop severe fever, cough, or shortness of breath, it is recommended that you go to the ER for further evaluation.    ED Prescriptions    Medication Sig Dispense Auth. Provider   predniSONE (DELTASONE) 20 MG tablet Take 2 tablets (40 mg total) by mouth daily for 5 days. 10 tablet Hall-Potvin, [place], PA-C   benzonatate (TESSALON) 100 MG capsule Take 1 capsule (100 mg total) by mouth every 8 (eight) hours. 21 capsule Hall-Potvin, [place], PA-C   albuterol (VENTOLIN HFA) 108 (90 Base) MCG/ACT inhaler Inhale 2 puffs into the lungs every 4 (four) hours as needed for wheezing or shortness of breath. 18 g Hall-Potvin, [place], PA-C   fluticasone (FLONASE) 50 MCG/ACT nasal spray Place 1 spray into both nostrils daily. 16 g Hall-Potvin, [place], PA-C     PDMP not reviewed this encounter.   Odette FractionHall-Potvin, [place], [place] 01/06/19 1939

## 2019-01-06 NOTE — Discharge Instructions (Addendum)
Your COVID test is pending: Is important you quarantine at home until your results are back. °You may take OTC Tylenol for fever and myalgias.  It is important to drink plenty of water throughout the day to stay hydrated. °If you test positive and later develop severe fever, cough, or shortness of breath, it is recommended that you go to the ER for further evaluation. °

## 2019-01-08 LAB — NOVEL CORONAVIRUS, NAA: SARS-CoV-2, NAA: NOT DETECTED

## 2019-02-17 ENCOUNTER — Ambulatory Visit
Admission: EM | Admit: 2019-02-17 | Discharge: 2019-02-17 | Disposition: A | Discharge status: Home / Self Care | Payer: 59 | Attending: Emergency Medicine | Admitting: Emergency Medicine

## 2019-02-17 ENCOUNTER — Encounter: Payer: Self-pay | Admitting: Emergency Medicine

## 2019-02-17 ENCOUNTER — Other Ambulatory Visit: Payer: Self-pay

## 2019-02-17 DIAGNOSIS — L0201 Cutaneous abscess of face: Secondary | ICD-10-CM | POA: Diagnosis not present

## 2019-02-17 MED ORDER — DOXYCYCLINE HYCLATE 100 MG PO CAPS: 100.0000 mg | ORAL_CAPSULE | Freq: Two times a day (BID) | ORAL | 0 refills | Status: DC

## 2019-02-17 NOTE — ED Notes (Signed)
Patient able to ambulate independently  

## 2019-02-17 NOTE — ED Provider Notes (Signed)
[name]    CSN: [id number] Arrival date & time: 02/17/19  0945      History   Chief Complaint Chief Complaint  Patient presents with  . Facial Swelling    HPI Tommy Abbott is a 43 y.o. male with history of facial abscess presenting for left-sided upper lip pain, swelling since Friday.  Patient tried "busting" his lip on Saturday: Produced small amount of thick, white discharge and mostly blood.  Had developed some upper lip swelling since.  Patient denies ACE inhibitor use, routine medication, change in diet/medication regimen over the preceding week.  No fever, myalgias, arthralgias, choking, difficulty breathing.  Patient does admit to poor dentition, though does denies dental pain, pain with mastication.  Has not tried any thing for symptoms.   History reviewed. No pertinent past medical history.  There are no active problems to display for this patient.   History reviewed. No pertinent surgical history.     Home Medications    Prior to Admission medications   Medication Sig Start Date End Date Taking? Authorizing Provider  albuterol (VENTOLIN HFA) 108 (90 Base) MCG/ACT inhaler Inhale 2 puffs into the lungs every 4 (four) hours as needed for wheezing or shortness of breath. 01/06/19   Hall-Potvin, [place], PA-C  doxycycline (VIBRAMYCIN) 100 MG capsule Take 1 capsule (100 mg total) by mouth 2 (two) times daily. 02/17/19   Hall-Potvin, [place], PA-C  fluticasone (FLONASE) 50 MCG/ACT nasal spray Place 1 spray into both nostrils daily. 01/06/19 02/17/19  Hall-Potvin, [place], PA-C    Family History No family history on file.  Social History Social History   Tobacco Use  . Smoking status: Current Every Day Smoker    Packs/day: 0.50  . Smokeless tobacco: Never Used  Substance Use Topics  . Alcohol use: Yes  . Drug use: No     Allergies   Patient has no known allergies.   Review of Systems Review of Systems  Constitutional: Negative for  fatigue and fever.  HENT: Positive for facial swelling. Negative for congestion, dental problem, drooling, ear pain, mouth sores, sore throat, trouble swallowing and voice change.   Respiratory: Negative for cough and shortness of breath.   Cardiovascular: Negative for chest pain and palpitations.  Gastrointestinal: Negative for abdominal pain, diarrhea and vomiting.  Musculoskeletal: Negative for arthralgias and myalgias.  Skin: Negative for rash and wound.  Neurological: Negative for speech difficulty and headaches.  All other systems reviewed and are negative.    Physical Exam Triage Vital Signs ED Triage Vitals  Enc Vitals Group     BP      Pulse      Resp      Temp      Temp src      SpO2      Weight      Height      Head Circumference      Peak Flow      Pain Score      Pain Loc      Pain Edu?      Excl. in GC?    No data found.  Updated Vital Signs BP 138/83 (BP Location: Left Arm)   Pulse 62   Temp 98.3 F (36.8 C) (Oral)   Resp 18   SpO2 96%   Visual Acuity Right Eye Distance:   Left Eye Distance:   Bilateral Distance:    Right Eye Near:   Left Eye Near:    Bilateral Near:  Physical Exam Constitutional:      General: He is not in acute distress.    Appearance: He is normal weight. He is not ill-appearing.  HENT:     Head: Normocephalic and atraumatic.     Mouth/Throat:     Mouth: Mucous membranes are moist.     Pharynx: Oropharynx is clear.     Comments: Poor dentition with several dental caries.  No TTP on all teeth.  No gingival injection, edema, fluctuance, tenderness.  Upper lip is edematous, nontender Eyes:     General: No scleral icterus.    Pupils: Pupils are equal, round, and reactive to light.  Neck:     Musculoskeletal: Neck supple. No muscular tenderness.  Cardiovascular:     Rate and Rhythm: Normal rate.  Pulmonary:     Effort: Pulmonary effort is normal.  Lymphadenopathy:     Cervical: No cervical adenopathy.  Skin:     Coloration: Skin is not jaundiced or pale.     Comments: Small, punctate wound from previous manual rupture that is well-healed without surrounding erythema.  Approximately 2 cm area of induration without fluctuance, redness.  Mild warmth, exquisite TTP.  No open wound appreciated on inside of lip.  Neurological:     Mental Status: He is alert and oriented to person, place, and time.      UC Treatments / Results  Labs (all labs ordered are listed, but only abnormal results are displayed) Labs Reviewed - No data to display  EKG   Radiology No results found.  Procedures Procedures (including critical care time)  Medications Ordered in UC Medications - No data to display  Initial Impression / Assessment and Plan / UC Course  I have reviewed the triage vital signs and the nursing notes.  Pertinent labs & imaging results that were available during my care of the patient were reviewed by me and considered in my medical decision making (see chart for details).     Patient afebrile, nontoxic.  Patient does have dental care, follow-up with them with regarding dental hygiene.  Patient be concerning for cutaneous abscess: We will start doxycycline today.  Patient to add hot compress to current regimen.  Return precautions discussed, patient verbalized understanding and is agreeable to plan. Final Clinical Impressions(s) / UC Diagnoses   Final diagnoses:  Cutaneous abscess of face     Discharge Instructions     Keep area(s) clean and dry. Apply hot compress / towel for 5-10 minutes 3-5 times daily. Take antibiotic as prescribed with food - important to complete course. Return for worsening pain, redness, swelling, discharge, fever. Go to ER if you develop difficulty breathing/choking.  Helpful prevention tips: Keep nails short to avoid secondary skin infections. Use new, clean razors when shaving    ED Prescriptions    Medication Sig Dispense Auth. Provider   doxycycline  (VIBRAMYCIN) 100 MG capsule Take 1 capsule (100 mg total) by mouth 2 (two) times daily. 20 capsule Hall-Potvin, [place], PA-C     PDMP not reviewed this encounter.   Hall-Potvin, [place], [place] 02/17/19 1125

## 2019-02-17 NOTE — Discharge Instructions (Addendum)
Keep area(s) clean and dry. Apply hot compress / towel for 5-10 minutes 3-5 times daily. Take antibiotic as prescribed with food - important to complete course. Return for worsening pain, redness, swelling, discharge, fever. Go to ER if you develop difficulty breathing/choking.  Helpful prevention tips: Keep nails short to avoid secondary skin infections. Use new, clean razors when shaving

## 2019-02-17 NOTE — ED Triage Notes (Signed)
Pt presents to [facility] for assessment of an "abscess" that he popped on his left upper lip/mustache area on Friday, and noted over the weekend increasing swelling and sensitivity to the area.

## 2019-06-04 ENCOUNTER — Ambulatory Visit
Admission: EM | Admit: 2019-06-04 | Discharge: 2019-06-04 | Disposition: A | Discharge status: Home / Self Care | Payer: 59 | Attending: Emergency Medicine | Admitting: Emergency Medicine

## 2019-06-04 DIAGNOSIS — M545 Low back pain, unspecified: Secondary | ICD-10-CM

## 2019-06-04 DIAGNOSIS — G8929 Other chronic pain: Secondary | ICD-10-CM

## 2019-06-04 MED ORDER — METHYLPREDNISOLONE SODIUM SUCC 125 MG IJ SOLR
80.0000 mg | Freq: Once | INTRAMUSCULAR | Status: AC
  Administered 2019-06-04: 80 mg via INTRAMUSCULAR

## 2019-06-04 MED ORDER — NAPROXEN 500 MG PO TABS: 500.0000 mg | ORAL_TABLET | Freq: Two times a day (BID) | ORAL | 0 refills | Status: DC

## 2019-06-04 NOTE — Discharge Instructions (Addendum)
Recommend RICE: rest, ice, compression, elevation as needed for pain.    Heat therapy (hot compress, warm wash red, hot showers, etc.) can help relax muscles and soothe muscle aches. Cold therapy (ice packs) can be used to help swelling both after injury and after prolonged use of areas of chronic pain/aches.  For pain: take naproxen as directed, may use tylenol as well

## 2019-06-04 NOTE — ED Triage Notes (Signed)
Pt c/o chronic back pain. C/o lower back pain since Saturday, denies injury. Denies pain radiating.

## 2019-06-04 NOTE — ED Provider Notes (Signed)
[name]    CSN: [id number] Arrival date & time: 06/04/19  1140      History   Chief Complaint Chief Complaint  Patient presents with  . Back Pain    HPI Tommy Abbott is a 44 y.o. male herniated disc low back pain presenting for acute on chronic bilateral low back pain without radiation.  Denies abdominal pain, change in bowel or bladder habit, hematochezia or melena, fever.  Patient denies inciting event or trauma.  Patient states only change to lifestyle is that he recently purchased a new car: States he has to get down into his car instead of climbing up into it.  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  Denies saddle area anesthesia, lower extremity weakness or numbness.   History reviewed. No pertinent past medical history.  There are no problems to display for this patient.   History reviewed. No pertinent surgical history.     Home Medications    Prior to Admission medications   Medication Sig Start Date End Date Taking? Authorizing Provider  naproxen (NAPROSYN) 500 MG tablet Take 1 tablet (500 mg total) by mouth 2 (two) times daily. 06/04/19   Hall-Potvin, [place], PA-C  fluticasone (FLONASE) 50 MCG/ACT nasal spray Place 1 spray into both nostrils daily. 01/06/19 02/17/19  Hall-Potvin, [place], PA-C    Family History History reviewed. No pertinent family history.  Social History Social History   Tobacco Use  . Smoking status: Current Every Day Smoker    Packs/day: 0.50  . Smokeless tobacco: Never Used  Substance Use Topics  . Alcohol use: Yes  . Drug use: No     Allergies   Patient has no known allergies.   Review of Systems As per HPI   Physical Exam Triage Vital Signs ED Triage Vitals  Enc Vitals Group     BP      Pulse      Resp      Temp      Temp src      SpO2      Weight      Height      Head Circumference      Peak Flow      Pain Score      Pain Loc      Pain Edu?      Excl. in [place]?     No data found.  Updated Vital Signs BP (!) 153/93 (BP Location: Left Arm)   Pulse 66   Temp 98 F (36.7 C) (Oral)   Resp 16   SpO2 98%   Visual Acuity Right Eye Distance:   Left Eye Distance:   Bilateral Distance:    Right Eye Near:   Left Eye Near:    Bilateral Near:     Physical Exam Constitutional:      General: He is not in acute distress. HENT:     Head: Normocephalic and atraumatic.  Eyes:     General: No scleral icterus.    Pupils: Pupils are equal, round, and reactive to light.  Cardiovascular:     Rate and Rhythm: Normal rate.  Pulmonary:     Effort: Pulmonary effort is normal. No respiratory distress.     Breath sounds: No wheezing.  Musculoskeletal:     Cervical back: Normal.     Thoracic back: Normal.     Lumbar back: Swelling and tenderness present. No spasms or bony tenderness. Decreased range of motion. Negative right straight leg  raise test and negative left straight leg raise test. No scoliosis.     Comments: Mild bilateral lumbar edema without crepitus, fluctuance, warmth: spares vertebral spinous process, PSIS.    Skin:    Coloration: Skin is not jaundiced or pale.  Neurological:     Mental Status: He is alert and oriented to person, place, and time.     Sensory: No sensory deficit.     Gait: Gait normal.     Deep Tendon Reflexes: Reflexes normal.      UC Treatments / Results  Labs (all labs ordered are listed, but only abnormal results are displayed) Labs Reviewed - No data to display  EKG   Radiology No results found.  Procedures Procedures (including critical care time)  Medications Ordered in UC Medications  methylPREDNISolone sodium succinate (SOLU-MEDROL) 125 mg/2 mL injection 80 mg (80 mg Intramuscular Given 06/04/19 1209)    Initial Impression / Assessment and Plan / UC Course  I have reviewed the triage vital signs and the nursing notes.  Pertinent labs & imaging results that were available during my care of the  patient were reviewed by me and considered in my medical decision making (see chart for details).     Patient appears well, no acute distress or injury: radiography deferred.  H&P consistent w/ acute on chronic low back pain.  Given IM solumedrol in office which he tolerated well.  Provided rehab exercises, supportive therapies as outlined below.  Contact information for Ortho given for further evaluation/management if needed.  Work note provided.  Return precautions discussed, patient verbalized understanding and is agreeable to plan. Final Clinical Impressions(s) / UC Diagnoses   Final diagnoses:  Chronic bilateral low back pain without sciatica     Discharge Instructions     Recommend RICE: rest, ice, compression, elevation as needed for pain.    Heat therapy (hot compress, warm wash red, hot showers, etc.) can help relax muscles and soothe muscle aches. Cold therapy (ice packs) can be used to help swelling both after injury and after prolonged use of areas of chronic pain/aches.  For pain: take naproxen as directed, may use tylenol as well    ED Prescriptions    Medication Sig Dispense Auth. Provider   naproxen (NAPROSYN) 500 MG tablet Take 1 tablet (500 mg total) by mouth 2 (two) times daily. 30 tablet Hall-Potvin, [place], PA-C     PDMP not reviewed this encounter.   Hall-Potvin, [place], [place] 06/04/19 1221

## 2019-08-20 ENCOUNTER — Emergency Department (HOSPITAL_COMMUNITY)
Admission: EM | Admit: 2019-08-20 | Discharge: 2019-08-20 | Disposition: A | Discharge status: Home / Self Care | Payer: 59 | Attending: Emergency Medicine | Admitting: Emergency Medicine

## 2019-08-20 ENCOUNTER — Other Ambulatory Visit: Payer: Self-pay

## 2019-08-20 ENCOUNTER — Encounter (HOSPITAL_COMMUNITY): Payer: Self-pay | Admitting: Emergency Medicine

## 2019-08-20 DIAGNOSIS — R1084 Generalized abdominal pain: Secondary | ICD-10-CM | POA: Diagnosis not present

## 2019-08-20 DIAGNOSIS — Z20822 Contact with and (suspected) exposure to covid-19: Secondary | ICD-10-CM | POA: Insufficient documentation

## 2019-08-20 DIAGNOSIS — R197 Diarrhea, unspecified: Secondary | ICD-10-CM | POA: Insufficient documentation

## 2019-08-20 DIAGNOSIS — F17210 Nicotine dependence, cigarettes, uncomplicated: Secondary | ICD-10-CM | POA: Insufficient documentation

## 2019-08-20 DIAGNOSIS — R112 Nausea with vomiting, unspecified: Secondary | ICD-10-CM | POA: Diagnosis present

## 2019-08-20 DIAGNOSIS — Z79899 Other long term (current) drug therapy: Secondary | ICD-10-CM | POA: Diagnosis not present

## 2019-08-20 LAB — CBC
HCT: 50.9 % (ref 39.0–52.0)
Hemoglobin: 17.2 g/dL — ABNORMAL HIGH (ref 13.0–17.0)
MCH: 32.8 pg (ref 26.0–34.0)
MCHC: 33.8 g/dL (ref 30.0–36.0)
MCV: 97.1 fL (ref 80.0–100.0)
Platelets: 250 10*3/uL (ref 150–400)
RBC: 5.24 MIL/uL (ref 4.22–5.81)
RDW: 14.2 % (ref 11.5–15.5)
WBC: 6 10*3/uL (ref 4.0–10.5)
nRBC: 0 % (ref 0.0–0.2)

## 2019-08-20 LAB — COMPREHENSIVE METABOLIC PANEL
ALT: 16 U/L (ref 0–44)
AST: 19 U/L (ref 15–41)
Albumin: 4.1 g/dL (ref 3.5–5.0)
Alkaline Phosphatase: 59 U/L (ref 38–126)
Anion gap: 11 (ref 5–15)
BUN: 11 mg/dL (ref 6–20)
CO2: 25 mmol/L (ref 22–32)
Calcium: 8.5 mg/dL — ABNORMAL LOW (ref 8.9–10.3)
Chloride: 103 mmol/L (ref 98–111)
Creatinine, Ser: 1.21 mg/dL (ref 0.61–1.24)
GFR calc Af Amer: >60 mL/min (ref >60)
GFR calc non Af Amer: >60 mL/min (ref >60)
Glucose, Bld: 108 mg/dL — ABNORMAL HIGH (ref 70–99)
Potassium: 3.8 mmol/L (ref 3.5–5.1)
Sodium: 139 mmol/L (ref 135–145)
Total Bilirubin: 0.2 mg/dL — ABNORMAL LOW (ref 0.3–1.2)
Total Protein: 7.6 g/dL (ref 6.5–8.1)

## 2019-08-20 LAB — URINALYSIS, ROUTINE W REFLEX MICROSCOPIC
Bilirubin Urine: NEGATIVE
Glucose, UA: NEGATIVE mg/dL
Hgb urine dipstick: NEGATIVE
Ketones, ur: NEGATIVE mg/dL
Leukocytes,Ua: NEGATIVE
Nitrite: NEGATIVE
Protein, ur: NEGATIVE mg/dL
Specific Gravity, Urine: 1.026 (ref 1.005–1.030)
pH: 6 (ref 5.0–8.0)

## 2019-08-20 LAB — LIPASE, BLOOD: Lipase: 31 U/L (ref 11–51)

## 2019-08-20 MED ORDER — ONDANSETRON HCL 4 MG/2ML IJ SOLN
4.0000 mg | Freq: Once | INTRAMUSCULAR | Status: AC
  Administered 2019-08-20: 4 mg via INTRAVENOUS
  Filled 2019-08-20: qty 2

## 2019-08-20 MED ORDER — ONDANSETRON 4 MG PO TBDP: ORAL_TABLET | ORAL | 0 refills | Status: DC

## 2019-08-20 MED ORDER — SODIUM CHLORIDE 0.9 % IV BOLUS
1000.0000 mL | Freq: Once | INTRAVENOUS | Status: AC
  Administered 2019-08-20: 1000 mL via INTRAVENOUS

## 2019-08-20 MED ORDER — FAMOTIDINE 20 MG PO TABS: 20.0000 mg | ORAL_TABLET | Freq: Two times a day (BID) | ORAL | 0 refills | Status: DC

## 2019-08-20 MED ORDER — FAMOTIDINE IN NACL 20-0.9 MG/50ML-% IV SOLN
20.0000 mg | Freq: Once | INTRAVENOUS | Status: AC
  Administered 2019-08-20: 20 mg via INTRAVENOUS
  Filled 2019-08-20: qty 50

## 2019-08-20 MED ORDER — SODIUM CHLORIDE 0.9% FLUSH: 3.0000 mL | Freq: Once | INTRAVENOUS | Status: DC

## 2019-08-20 MED ORDER — DICYCLOMINE HCL 20 MG PO TABS: 20.0000 mg | ORAL_TABLET | Freq: Two times a day (BID) | ORAL | 0 refills | Status: DC

## 2019-08-20 MED ORDER — DICYCLOMINE HCL 10 MG/ML IM SOLN
20.0000 mg | Freq: Once | INTRAMUSCULAR | Status: AC
  Administered 2019-08-20: 20 mg via INTRAMUSCULAR
  Filled 2019-08-20: qty 2

## 2019-08-20 NOTE — ED Triage Notes (Signed)
Pt c/o abd pains with n/v x 3 days. Denies urinary problems.

## 2019-08-20 NOTE — ED Notes (Signed)
Given ginger ale 

## 2019-08-20 NOTE — Discharge Instructions (Addendum)
Your lab work today is reassuring, I suspect symptoms may be due to foodborne illness or viral GI bug.  Treat your symptoms supportively with plenty of fluids, Zofran as needed for nausea and vomiting, Pepcid twice daily to help with acid and stomach irritation, and Bentyl as needed for cramping abdominal pain.  Start with clear liquids then bland foods and advance your diet slowly as tolerated.  If you begin having severe or more localized abdominal pain, fevers, persistent vomiting that does not improve in the next day or so return to the ED for reevaluation.  You have a Covid test pending and you should quarantine at home until you receive your results, these should be back in about 1 to 2 days, you will receive a phone call if results are positive, you can view negative results online through MyChart.  Please follow the instructions on your paperwork today to set up your MyChart account.

## 2019-08-20 NOTE — ED Provider Notes (Signed)
[facility]-EMERGENCY DEPT Provider Note   CSN: [id number] Arrival date & time: 08/20/19  3557     History Chief Complaint  Patient presents with  . Abdominal Pain  . Emesis  . Diarrhea    Tommy Abbott is a 44 y.o. male.  Tommy Abbott is a 44 y.o. male who is otherwise healthy, presents to the emergency department for evaluation of nausea, vomiting, diarrhea and abdominal pain.  He reports 2 days ago he began feeling poorly after going out to eat with his wife.  He reports yesterday morning he woke up with vomiting and diarrhea.  He reports associated cramping generalized abdominal pain that is usually worse before he has an episode of vomiting or diarrhea.  He reports he has had some chills and sweats with this as well.  Abdominal pain seems to come and go when he is also had some burning epigastric discomfort.  This morning he has continued to have persistent vomiting and diarrhea and has not been able to keep anything down.  He denies any other sick contacts, his wife is not experiencing similar symptoms, but she did eat something different at the restaurant.  He reports abdominal pain does not localize to one area.  He has not had any fevers at home.  Reports he has had an occasional cough and some nasal congestion prior to abdominal symptoms occurring, which he thought may have been allergies.  He has not had his Covid vaccine.  Denies any potential Covid contacts.  He has not taken any medications to treat his symptoms prior to arrival.  No other aggravating or alleviating factors.        History reviewed. No pertinent past medical history.  There are no problems to display for this patient.   History reviewed. No pertinent surgical history.     No family history on file.  Social History   Tobacco Use  . Smoking status: Current Every Day Smoker    Packs/day: 0.50  . Smokeless tobacco: Never Used  Substance Use Topics  . Alcohol use: Yes  . Drug  use: No    Home Medications Prior to Admission medications   Medication Sig Start Date End Date Taking? Authorizing Provider  naproxen (NAPROSYN) 500 MG tablet Take 1 tablet (500 mg total) by mouth 2 (two) times daily. 06/04/19   Hall-Potvin, [place], PA-C  fluticasone (FLONASE) 50 MCG/ACT nasal spray Place 1 spray into both nostrils daily. 01/06/19 02/17/19  Hall-Potvin, [place], PA-C    Allergies    Patient has no known allergies.  Review of Systems   Review of Systems  Constitutional: Positive for chills. Negative for fever.  HENT: Positive for congestion.   Respiratory: Positive for cough. Negative for shortness of breath.   Cardiovascular: Negative for chest pain.  Gastrointestinal: Positive for abdominal pain, diarrhea, nausea and vomiting. Negative for blood in stool and constipation.  Genitourinary: Negative for dysuria, flank pain and frequency.  Musculoskeletal: Positive for myalgias. Negative for arthralgias.  Skin: Negative for color change and rash.  Neurological: Negative for dizziness, syncope, light-headedness and headaches.    Physical Exam Updated Vital Signs BP 140/89   Pulse 76   Temp 98 F (36.7 C) (Oral)   Resp 17   SpO2 100%   Physical Exam Vitals and nursing note reviewed.  Constitutional:      General: He is not in acute distress.    Appearance: He is well-developed and normal weight. He is not ill-appearing or diaphoretic.  Comments: Well-appearing and in no distress  HENT:     Head: Normocephalic and atraumatic.  Eyes:     General:        Right eye: No discharge.        Left eye: No discharge.     Pupils: Pupils are equal, round, and reactive to light.  Cardiovascular:     Rate and Rhythm: Normal rate and regular rhythm.     Heart sounds: Normal heart sounds. No murmur. No friction rub. No gallop.   Pulmonary:     Effort: Pulmonary effort is normal. No respiratory distress.     Breath sounds: Normal breath sounds. No wheezing or rales.      Comments: Respirations equal and unlabored, patient able to speak in full sentences, lungs clear to auscultation bilaterally Abdominal:     General: Bowel sounds are normal. There is no distension.     Palpations: Abdomen is soft. There is no mass.     Tenderness: There is generalized abdominal tenderness. There is no guarding.     Comments: Abdomen is soft, nondistended, bowel sounds present throughout, there is some mild generalized tenderness throughout the abdomen that does not localize to one area, no guarding or peritoneal signs  Musculoskeletal:        General: No deformity.     Cervical back: Neck supple.  Skin:    General: Skin is warm and dry.     Capillary Refill: Capillary refill takes less than 2 seconds.  Neurological:     Mental Status: He is alert.     Coordination: Coordination normal.     Comments: Speech is clear, able to follow commands Moves extremities without ataxia, coordination intact  Psychiatric:        Mood and Affect: Mood normal.        Behavior: Behavior normal.     ED Results / Procedures / Treatments   Labs (all labs ordered are listed, but only abnormal results are displayed) Labs Reviewed  COMPREHENSIVE METABOLIC PANEL - Abnormal; Notable for the following components:      Result Value   Glucose, Bld 108 (*)    Calcium 8.5 (*)    Total Bilirubin 0.2 (*)    All other components within normal limits  CBC - Abnormal; Notable for the following components:   Hemoglobin 17.2 (*)    All other components within normal limits  SARS CORONAVIRUS 2 (TAT 6-24 HRS)  LIPASE, BLOOD  URINALYSIS, ROUTINE W REFLEX MICROSCOPIC    EKG None  Radiology No results found.  Procedures Procedures (including critical care time)  Medications Ordered in ED Medications  sodium chloride flush (NS) 0.9 % injection 3 mL (3 mLs Intravenous Not Given 08/20/19 0825)  ondansetron (ZOFRAN) injection 4 mg (4 mg Intravenous Given 08/20/19 0823)  sodium chloride 0.9  % bolus 1,000 mL (0 mLs Intravenous Stopped 08/20/19 0931)  famotidine (PEPCID) IVPB 20 mg premix (0 mg Intravenous Stopped 08/20/19 0931)  dicyclomine (BENTYL) injection 20 mg (20 mg Intramuscular Given 08/20/19 5397)    ED Course  I have reviewed the triage vital signs and the nursing notes.  Pertinent labs & imaging results that were available during my care of the patient were reviewed by me and considered in my medical decision making (see chart for details).    MDM Rules/Calculators/A&P                      Patient presents to the ED with complaints of  abdominal pain. Patient nontoxic appearing, in no apparent distress, vitals WNL. On exam patient with mild generalized tenderness throughout but no focal tenderness or guarding, no peritoneal signs. Will evaluate with labs. Analgesics, anti-emetics, and fluids administered.  Suspect viral gastroenteritis or foodborne illness, do not feel that abdominal imaging is indicated at this time  ER work-up reviewed:  CBC: No leukocytosis, slightly elevated hemoglobin, likely hemoconcentration in the setting of dehydration CMP: Glucose of 108, slightly low calcium at 8.5, no other electrolyte derangements, normal renal and liver function Lipase: WNL UA: No hematuria or signs of infection Covid: Pending  On repeat abdominal exam patient remains without peritoneal signs, doubt cholecystitis, pancreatitis, diverticulitis, appendicitis, bowel obstruction/perforation. Patient tolerating PO in the emergency department. Will discharge home with supportive measures. I discussed results, treatment plan, need for PCP follow-up, and return precautions with the patient. Provided opportunity for questions, patient confirmed understanding and is in agreement with plan.    Final Clinical Impression(s) / ED Diagnoses Final diagnoses:  Nausea vomiting and diarrhea  Generalized abdominal pain    Rx / DC Orders ED Discharge Orders         Ordered     dicyclomine (BENTYL) 20 MG tablet  2 times daily     08/20/19 1028    famotidine (PEPCID) 20 MG tablet  2 times daily,   Status:  Discontinued     08/20/19 1028    ondansetron (ZOFRAN ODT) 4 MG disintegrating tablet     08/20/19 1028    famotidine (PEPCID) 20 MG tablet  2 times daily     08/20/19 1032           Benedetto Goad [place], [place] 08/20/19 1048    Gareth Morgan, MD 08/20/19 2216

## 2019-08-21 ENCOUNTER — Ambulatory Visit: Payer: Self-pay

## 2019-08-21 LAB — SARS CORONAVIRUS 2 (TAT 6-24 HRS): SARS Coronavirus 2: NEGATIVE

## 2019-08-21 NOTE — Telephone Encounter (Signed)
Patient called after being seen in the Er for diarrhea. He states that he had soup this Am and noticed that it came out in his stool. He states that his stool is yellow/brown water with flecks if soup. He states that in the ER they gave him IV fluids and sent him home with nausea medication.  He denies nausea. He has had 3-5 stools since coming home yesterday. He states he is voiding regularly. He states he feels much better. He was just concerned that he saw undigested food in his stool.  He denies fever dizziness weakness. Home care advice was read to patient. He verbalized understanding. He will seek medical care for worsening of symptoms dry mouth, dizziness weakness. Reason for Disposition . SEVERE diarrhea (e.g., 7 or more times / day more than normal)  Answer Assessment - Initial Assessment Questions 1. DIARRHEA SEVERITY: "How bad is the diarrhea?" "How many extra stools have you had in the past 24 hours than normal?"    - NO DIARRHEA (SCALE 0)   - MILD (SCALE 1-3): Few loose or mushy BMs; increase of 1-3 stools over normal daily number of stools; mild increase in ostomy output.   -  MODERATE (SCALE 4-7): Increase of 4-6 stools daily over normal; moderate increase in ostomy output. * SEVERE (SCALE 8-10; OR 'WORST POSSIBLE'): Increase of 7 or more stools daily over normal; moderate increase in ostomy output; incontinence.     3-5 times 2. ONSET: "When did the diarrhea begin?"     Yesterday at ER for symptoms 3. BM CONSISTENCY: "How loose or watery is the diarrhea?"     Yellow water with flecks 4. VOMITING: "Are you also vomiting?" If so, ask: "How many times in the past 24 hours?"      no 5. ABDOMINAL PAIN: "Are you having any abdominal pain?" If yes: "What does it feel like?" (e.g., crampy, dull, intermittent, constant)      Intermittent cramping 6. ABDOMINAL PAIN SEVERITY: If present, ask: "How bad is the pain?"  (e.g., Scale 1-10; mild, moderate, or severe)   - MILD (1-3): doesn't  interfere with normal activities, abdomen soft and not tender to touch    - MODERATE (4-7): interferes with normal activities or awakens from sleep, tender to touch    - SEVERE (8-10): excruciating pain, doubled over, unable to do any normal activities      3 7. ORAL INTAKE: If vomiting, "Have you been able to drink liquids?" "How much fluids have you had in the past 24 hours?"    Soup , drinking gatorade 8. HYDRATION: "Any signs of dehydration?" (e.g., dry mouth [not just dry lips], too weak to stand, dizziness, new weight loss) "When did you last urinate?"     Feel a lot better 9. EXPOSURE: "Have you traveled to a foreign country recently?" "Have you been exposed to anyone with diarrhea?" "Could you have eaten any food that was spoiled?"    Dx virus yesterday 10. ANTIBIOTIC USE: "Are you taking antibiotics now or have you taken antibiotics in the past 2 months?"       no 11. OTHER SYMPTOMS: "Do you have any other symptoms?" (e.g., fever, blood in stool)       None 12. PREGNANCY: "Is there any chance you are pregnant?" "When was your last menstrual period?"      N/A  Protocols used: DIARRHEA-A-AH

## 2019-08-31 ENCOUNTER — Ambulatory Visit
Admission: EM | Admit: 2019-08-31 | Discharge: 2019-08-31 | Disposition: A | Discharge status: Home / Self Care | Payer: 59 | Attending: Physician Assistant | Admitting: Physician Assistant

## 2019-08-31 ENCOUNTER — Other Ambulatory Visit: Payer: Self-pay

## 2019-08-31 DIAGNOSIS — M545 Low back pain: Secondary | ICD-10-CM

## 2019-08-31 DIAGNOSIS — G8929 Other chronic pain: Secondary | ICD-10-CM

## 2019-08-31 MED ORDER — NAPROXEN 500 MG PO TABS: 500.0000 mg | ORAL_TABLET | Freq: Two times a day (BID) | ORAL | 0 refills | Status: DC

## 2019-08-31 MED ORDER — METHYLPREDNISOLONE SODIUM SUCC 125 MG IJ SOLR
80.0000 mg | Freq: Once | INTRAMUSCULAR | Status: AC
  Administered 2019-08-31: 80 mg via INTRAMUSCULAR

## 2019-08-31 NOTE — ED Provider Notes (Signed)
[name]    CSN: [id number] Arrival date & time: 08/31/19  0850      History   Chief Complaint Chief Complaint  Patient presents with  . Back Pain    HPI Tommy Abbott is a 44 y.o. male.   44 year old male comes in for 2-day history of acute on chronic back pain.  Has history of herniated disc with intermittent flareups.  Denies recent injury/trauma.  Pain is bilateral, right worse than left, exacerbated more with movement.  Denies saddle anesthesia, loss of bladder or bowel control.  Was seen for similar symptoms few months ago, states Solu-Medrol injection improved symptoms significantly.     History reviewed. No pertinent past medical history.  There are no problems to display for this patient.   History reviewed. No pertinent surgical history.     Home Medications    Prior to Admission medications   Medication Sig Start Date End Date Taking? Authorizing Provider  naproxen (NAPROSYN) 500 MG tablet Take 1 tablet (500 mg total) by mouth 2 (two) times daily. 08/31/19   Tasia Catchings, Marielis Samara V, PA-C  dicyclomine (BENTYL) 20 MG tablet Take 1 tablet (20 mg total) by mouth 2 (two) times daily. 08/20/19 08/31/19  Jacqlyn Larsen, PA-C  famotidine (PEPCID) 20 MG tablet Take 1 tablet (20 mg total) by mouth 2 (two) times daily. 08/20/19 08/31/19  Jacqlyn Larsen, PA-C  fluticasone (FLONASE) 50 MCG/ACT nasal spray Place 1 spray into both nostrils daily. 01/06/19 02/17/19  Hall-Potvin, [place], PA-C    Family History History reviewed. No pertinent family history.  Social History Social History   Tobacco Use  . Smoking status: Current Every Day Smoker    Packs/day: 0.50  . Smokeless tobacco: Never Used  Substance Use Topics  . Alcohol use: Not Currently  . Drug use: Yes    Types: Marijuana     Allergies   Patient has no known allergies.   Review of Systems Review of Systems  Reason unable to perform ROS: See HPI as above.     Physical Exam Triage Vital  Signs ED Triage Vitals  Enc Vitals Group     BP 08/31/19 0856 114/76     Pulse Rate 08/31/19 0856 62     Resp 08/31/19 0856 15     Temp 08/31/19 0856 98 F (36.7 C)     Temp Source 08/31/19 0856 Oral     SpO2 08/31/19 0856 98 %     Weight --      Height --      Head Circumference --      Peak Flow --      Pain Score 08/31/19 0858 10     Pain Loc --      Pain Edu? --      Excl. in [place]? --    No data found.  Updated Vital Signs BP 114/76 (BP Location: Left Arm)   Pulse 62   Temp 98 F (36.7 C) (Oral)   Resp 15   SpO2 98%   Visual Acuity Right Eye Distance:   Left Eye Distance:   Bilateral Distance:    Right Eye Near:   Left Eye Near:    Bilateral Near:     Physical Exam Constitutional:      General: He is not in acute distress.    Appearance: He is well-developed. He is not diaphoretic.  HENT:     Head: Normocephalic and atraumatic.  Eyes:     Conjunctiva/sclera: Conjunctivae normal.  Pupils: Pupils are equal, round, and reactive to light.  Cardiovascular:     Rate and Rhythm: Normal rate and regular rhythm.     Heart sounds: Normal heart sounds. No murmur. No friction rub. No gallop.   Pulmonary:     Effort: Pulmonary effort is normal. No accessory muscle usage or respiratory distress.     Breath sounds: Normal breath sounds. No stridor. No decreased breath sounds, wheezing, rhonchi or rales.  Musculoskeletal:     Comments: No reproducible pain on palpation.  Pain exacerbated with range of motion of back.  Full range of motion of back and hips.  Negative straight leg raise.  Strength normal and equal bilaterally. Sensation intact and equal bilaterally.  Skin:    General: Skin is warm and dry.  Neurological:     Mental Status: He is alert and oriented to person, place, and time.      UC Treatments / Results  Labs (all labs ordered are listed, but only abnormal results are displayed) Labs Reviewed - No data to display  EKG   Radiology No results  found.  Procedures Procedures (including critical care time)  Medications Ordered in UC Medications  methylPREDNISolone sodium succinate (SOLU-MEDROL) 125 mg/2 mL injection 80 mg (80 mg Intramuscular Given 08/31/19 0930)    Initial Impression / Assessment and Plan / UC Course  I have reviewed the triage vital signs and the nursing notes.  Pertinent labs & imaging results that were available during my care of the patient were reviewed by me and considered in my medical decision making (see chart for details).    And provide Solu-Medrol injection in office.  NSAID as needed.  Patient to follow-up with sports medicine for further evaluation and management needed for chronic back pain.  Return precautions given.  Patient expresses understanding and agrees to plan.  Final Clinical Impressions(s) / UC Diagnoses   Final diagnoses:  Chronic bilateral low back pain without sciatica    ED Prescriptions    Medication Sig Dispense Auth. Provider   naproxen (NAPROSYN) 500 MG tablet Take 1 tablet (500 mg total) by mouth 2 (two) times daily. 30 tablet Belinda Fisher, PA-C     PDMP not reviewed this encounter.   Belinda Fisher, PA-C 08/31/19 1015

## 2019-08-31 NOTE — ED Triage Notes (Signed)
Patient is here with low back pain that he notes started yesterday. He states having a history of back pain.

## 2019-08-31 NOTE — Discharge Instructions (Signed)
Solu-Medrol injection given in office today. If symptoms not improving, can start naproxen as directed. Rest, stretches as directed. Follow-up with sports medicine for further evaluation and management for chronic back pain. If experiencing numbness tingling to the inner thighs, loss of bladder or bowel control, go to the emergency department for further evaluation.

## 2019-11-05 ENCOUNTER — Ambulatory Visit (INDEPENDENT_AMBULATORY_CARE_PROVIDER_SITE_OTHER): Payer: 59

## 2019-11-05 ENCOUNTER — Ambulatory Visit
Admission: EM | Admit: 2019-11-05 | Discharge: 2019-11-05 | Disposition: A | Discharge status: Home / Self Care | Payer: 59 | Attending: Emergency Medicine | Admitting: Emergency Medicine

## 2019-11-05 ENCOUNTER — Other Ambulatory Visit: Payer: Self-pay

## 2019-11-05 DIAGNOSIS — R05 Cough: Secondary | ICD-10-CM

## 2019-11-05 DIAGNOSIS — R0602 Shortness of breath: Secondary | ICD-10-CM | POA: Diagnosis not present

## 2019-11-05 DIAGNOSIS — J42 Unspecified chronic bronchitis: Secondary | ICD-10-CM

## 2019-11-05 MED ORDER — ALBUTEROL SULFATE HFA 108 (90 BASE) MCG/ACT IN AERS: 2.0000 | INHALATION_SPRAY | RESPIRATORY_TRACT | 0 refills | Status: DC | PRN

## 2019-11-05 MED ORDER — AEROCHAMBER PLUS FLO-VU MEDIUM MISC: 1.0000 | Freq: Once | 0 refills | Status: AC

## 2019-11-05 MED ORDER — PREDNISONE 50 MG PO TABS
50.0000 mg | ORAL_TABLET | Freq: Every day | ORAL | 0 refills | Status: DC
Start: 2019-11-05 — End: 2019-11-20

## 2019-11-05 MED ORDER — BENZONATATE 100 MG PO CAPS
100.0000 mg | ORAL_CAPSULE | Freq: Three times a day (TID) | ORAL | 0 refills | Status: DC
Start: 2019-11-05 — End: 2019-11-20

## 2019-11-05 NOTE — ED Triage Notes (Signed)
Pt reports having shortness of breath x3 weeks. Reports having non-productive cough. sts having bronchitis in the past in this feels similar. Neg covid test Saturday.

## 2019-11-05 NOTE — ED Provider Notes (Signed)
[name]    CSN: [id number] Arrival date & time: 11/05/19  1015      History   Chief Complaint Chief Complaint  Patient presents with  . Shortness of Breath    HPI Tommy Abbott is a 44 y.o. male  Subjective:   Tommy Abbott is a 44 y.o. male here for evaluation of a cough.  The cough is productive of clear sputum, with shortness of breath during the cough, worsening over time and is aggravated by exercise and fumes. Onset of symptoms was 3 weeks ago, gradually worsening since that time.  Associated symptoms include wheezing. Patient does not have a history of asthma. Patient has not had recent travel. Patient does have a history of smoking. Patient  has not had a previous chest x-ray.  Underwent Covid testing Saturday: Negative.  No known sick contacts.  The following portions of the patient's history were reviewed and updated as appropriate: allergies, current medications, past family history, past medical history, past social history, past surgical history and problem list.     History reviewed. No pertinent past medical history.  There are no problems to display for this patient.   History reviewed. No pertinent surgical history.     Home Medications    Prior to Admission medications   Medication Sig Start Date End Date Taking? Authorizing Provider  albuterol (VENTOLIN HFA) 108 (90 Base) MCG/ACT inhaler Inhale 2 puffs into the lungs every 4 (four) hours as needed for wheezing or shortness of breath. 11/05/19   Hall-Potvin, [place], PA-C  benzonatate (TESSALON) 100 MG capsule Take 1 capsule (100 mg total) by mouth every 8 (eight) hours. 11/05/19   Hall-Potvin, [place], PA-C  predniSONE (DELTASONE) 50 MG tablet Take 1 tablet (50 mg total) by mouth daily with breakfast. 11/05/19   Hall-Potvin, [place], PA-C  Spacer/Aero-Holding Chambers (AEROCHAMBER PLUS FLO-VU MEDIUM) MISC 1 each by Other route once for 1 dose. 11/05/19 11/05/19  Hall-Potvin, [place], PA-C    dicyclomine (BENTYL) 20 MG tablet Take 1 tablet (20 mg total) by mouth 2 (two) times daily. 08/20/19 08/31/19  Dartha Lodge, PA-C  famotidine (PEPCID) 20 MG tablet Take 1 tablet (20 mg total) by mouth 2 (two) times daily. 08/20/19 08/31/19  Dartha Lodge, PA-C  fluticasone (FLONASE) 50 MCG/ACT nasal spray Place 1 spray into both nostrils daily. 01/06/19 02/17/19  Hall-Potvin, [place], PA-C    Family History History reviewed. No pertinent family history.  Social History Social History   Tobacco Use  . Smoking status: Current Every Day Smoker    Packs/day: 0.50  . Smokeless tobacco: Never Used  Substance Use Topics  . Alcohol use: Yes  . Drug use: Yes    Types: Marijuana     Allergies   Patient has no known allergies.   Review of Systems As per HPI   Physical Exam Triage Vital Signs ED Triage Vitals  Enc Vitals Group     BP      Pulse      Resp      Temp      Temp src      SpO2      Weight      Height      Head Circumference      Peak Flow      Pain Score      Pain Loc      Pain Edu?      Excl. in [place]?    No data found.  Updated Vital Signs  BP 120/83   Pulse 66   Temp 97.9 F (36.6 C) (Oral)   Resp 16   Ht 6\' 5"  (1.956 m)   Wt 200 lb (90.7 kg)   SpO2 97%   BMI 23.72 kg/m   Visual Acuity Right Eye Distance:   Left Eye Distance:   Bilateral Distance:    Right Eye Near:   Left Eye Near:    Bilateral Near:     Physical Exam   UC Treatments / Results  Labs (all labs ordered are listed, but only abnormal results are displayed) Labs Reviewed - No data to display  EKG   Radiology DG Chest 2 View  Result Date: 11/05/2019 CLINICAL DATA:  Shortness of breath, cough. EXAM: CHEST - 2 VIEW COMPARISON:  Aug 14, 2007. FINDINGS: The heart size and mediastinal contours are within normal limits. Both lungs are clear. No pneumothorax or pleural effusion is noted. The visualized skeletal structures are unremarkable. IMPRESSION: No active cardiopulmonary  disease. Electronically Signed   By: Aug 16, 2007 M.D.   On: 11/05/2019 11:24    Procedures Procedures (including critical care time)  Medications Ordered in UC Medications - No data to display  Initial Impression / Assessment and Plan / UC Course  I have reviewed the triage vital signs and the nursing notes.  Pertinent labs & imaging results that were available during my care of the patient were reviewed by me and considered in my medical decision making (see chart for details).     Plan:  Explained lack of efficacy of antibiotics in viral disease. Antitussives per medication orders. Avoid exposure to tobacco smoke and fumes. B-agonist inhaler. Call if shortness of breath worsens, blood in sputum, change in character of cough, development of fever or chills, inability to maintain nutrition and hydration. Avoid exposure to tobacco smoke and fumes.  Final Clinical Impressions(s) / UC Diagnoses   Final diagnoses:  Chronic bronchitis, unspecified chronic bronchitis type [facility])     Discharge Instructions     Tessalon for cough. Start flonase, atrovent nasal spray for nasal congestion/drainage. You can use over the counter nasal saline rinse such as neti pot for nasal congestion. Keep hydrated, your urine should be clear to pale yellow in color. Tylenol/motrin for fever and pain. Monitor for any worsening of symptoms, chest pain, shortness of breath, wheezing, swelling of the throat, go to the emergency department for further evaluation needed.     ED Prescriptions    Medication Sig Dispense Auth. Provider   predniSONE (DELTASONE) 50 MG tablet Take 1 tablet (50 mg total) by mouth daily with breakfast. 5 tablet Hall-Potvin, [facility], PA-C   albuterol (VENTOLIN HFA) 108 (90 Base) MCG/ACT inhaler Inhale 2 puffs into the lungs every 4 (four) hours as needed for wheezing or shortness of breath. 18 g Hall-Potvin, [place], PA-C   Spacer/Aero-Holding Chambers (AEROCHAMBER PLUS FLO-VU  MEDIUM) MISC 1 each by Other route once for 1 dose. 1 each Hall-Potvin, [place], PA-C   benzonatate (TESSALON) 100 MG capsule Take 1 capsule (100 mg total) by mouth every 8 (eight) hours. 21 capsule Hall-Potvin, [place], PA-C     PDMP not reviewed this encounter.   Hall-Potvin, [place], [place] 11/05/19 1153

## 2019-11-05 NOTE — Discharge Instructions (Signed)

## 2019-11-20 ENCOUNTER — Other Ambulatory Visit: Payer: Self-pay

## 2019-11-20 ENCOUNTER — Ambulatory Visit
Admission: EM | Admit: 2019-11-20 | Discharge: 2019-11-20 | Disposition: A | Discharge status: Home / Self Care | Payer: 59 | Attending: Emergency Medicine | Admitting: Emergency Medicine

## 2019-11-20 ENCOUNTER — Encounter: Payer: Self-pay | Admitting: Emergency Medicine

## 2019-11-20 DIAGNOSIS — M545 Low back pain, unspecified: Secondary | ICD-10-CM

## 2019-11-20 MED ORDER — METHYLPREDNISOLONE SODIUM SUCC 125 MG IJ SOLR
125.0000 mg | Freq: Once | INTRAMUSCULAR | Status: AC
  Administered 2019-11-20: 125 mg via INTRAMUSCULAR

## 2019-11-20 MED ORDER — NAPROXEN 500 MG PO TABS
500.0000 mg | ORAL_TABLET | Freq: Two times a day (BID) | ORAL | 0 refills | Status: DC
Start: 2019-11-20 — End: 2020-02-17

## 2019-11-20 NOTE — ED Triage Notes (Signed)
History of back pain.  Currently has lower back pain.  No known injury.  Reports back pain occurs periodically.  No pain in either leg

## 2019-11-20 NOTE — Discharge Instructions (Signed)
Heat therapy (hot compress, warm wash rag, hot showers, etc.) can help relax muscles and soothe muscle aches. Cold therapy (ice packs) can be used to help swelling both after injury and after prolonged use of areas of chronic pain/aches.  Pain medication:  350 mg-1000 mg of Tylenol (acetaminophen) and/or 200 mg - 800 mg of Advil (ibuprofen, Motrin) every 8 hours as needed.  May alternate between the two throughout the day as they are generally safe to take together.  DO NOT exceed more than 3000 mg of Tylenol or 3200 mg of ibuprofen in a 24 hour period as this could damage your stomach, kidneys, liver, or increase your bleeding risk.  Important to follow up with specialist(s) below for further evaluation/management if your symptoms persist or worsen. 

## 2019-11-20 NOTE — ED Provider Notes (Addendum)
Tommy Abbott    CSN: [id number] Arrival date & time: 11/20/19  8453      History   Chief Complaint Chief Complaint  Patient presents with  . Back Pain    HPI Tommy Abbott is a 44 y.o. male presenting for right-sided low back pain.  Has been previously evaluated for this by me (last 08/31/2019).  Tolerated previous treatment (see notes), though has not followed up with specialist.  Denies trauma, inciting event.  No saddle area anesthesia, lower leg numbness, weakness, urinary retention or fecal incontinence, fever.  Has tried ibuprofen without relief.   History reviewed. No pertinent past medical history.  There are no problems to display for this patient.   History reviewed. No pertinent surgical history.     Home Medications    Prior to Admission medications   Medication Sig Start Date End Date Taking? Authorizing Provider  albuterol (VENTOLIN HFA) 108 (90 Base) MCG/ACT inhaler Inhale 2 puffs into the lungs every 4 (four) hours as needed for wheezing or shortness of breath. 11/05/19   Hall-Potvin, [place], PA-C  naproxen (NAPROSYN) 500 MG tablet Take 1 tablet (500 mg total) by mouth 2 (two) times daily. 11/20/19   Hall-Potvin, [place], PA-C  dicyclomine (BENTYL) 20 MG tablet Take 1 tablet (20 mg total) by mouth 2 (two) times daily. 08/20/19 08/31/19  Dartha Lodge, PA-C  famotidine (PEPCID) 20 MG tablet Take 1 tablet (20 mg total) by mouth 2 (two) times daily. 08/20/19 08/31/19  Dartha Lodge, PA-C  fluticasone (FLONASE) 50 MCG/ACT nasal spray Place 1 spray into both nostrils daily. 01/06/19 02/17/19  Hall-Potvin, [place], PA-C    Family History Family History  Problem Relation Age of Onset  . Diabetes Mother     Social History Social History   Tobacco Use  . Smoking status: Current Every Day Smoker    Packs/day: 0.50  . Smokeless tobacco: Never Used  Substance Use Topics  . Alcohol use: Yes  . Drug use: Yes    Types: Marijuana     Allergies     Patient has no known allergies.   Review of Systems As per HPI   Physical Exam Triage Vital Signs ED Triage Vitals  Enc Vitals Group     BP 11/20/19 0849 129/84     Pulse Rate 11/20/19 0849 73     Resp 11/20/19 0849 18     Temp 11/20/19 0849 98.1 F (36.7 C)     Temp Source 11/20/19 0849 Oral     SpO2 11/20/19 0849 96 %     Weight --      Height --      Head Circumference --      Peak Flow --      Pain Score 11/20/19 0846 10     Pain Loc --      Pain Edu? --      Excl. in GC? --    No data found.  Updated Vital Signs BP 129/84 (BP Location: Left Arm)   Pulse 73   Temp 98.1 F (36.7 C) (Oral)   Resp 18   SpO2 96%   Visual Acuity Right Eye Distance:   Left Eye Distance:   Bilateral Distance:    Right Eye Near:   Left Eye Near:    Bilateral Near:     Physical Exam Constitutional:      General: He is not in acute distress. HENT:     Head: Normocephalic and atraumatic.  Eyes:  General: No scleral icterus.    Pupils: Pupils are equal, round, and reactive to light.  Cardiovascular:     Rate and Rhythm: Normal rate.  Pulmonary:     Effort: Pulmonary effort is normal. No respiratory distress.     Breath sounds: No wheezing.  Musculoskeletal:        General: Tenderness present. No swelling. Normal range of motion.     Right lower leg: No edema.     Left lower leg: No edema.     Comments: Right lumbar tenderness that is diffuse.  No swelling, mass, spasm appreciated.  No spinous process or PSIS tenderness.  Skin:    Capillary Refill: Capillary refill takes less than 2 seconds.     Coloration: Skin is not jaundiced or pale.     Findings: No rash.  Neurological:     General: No focal deficit present.     Mental Status: He is alert and oriented to person, place, and time.      UC Treatments / Results  Labs (all labs ordered are listed, but only abnormal results are displayed) Labs Reviewed - No data to display  EKG   Radiology No results  found.  Procedures Procedures (including critical Abbott time)  Medications Ordered in UC Medications  methylPREDNISolone sodium succinate (SOLU-MEDROL) 125 mg/2 mL injection 125 mg (125 mg Intramuscular Given 11/20/19 0917)    Initial Impression / Assessment and Plan / UC Course  I have reviewed the triage vital signs and the nursing notes.  Pertinent labs & imaging results that were available during my Abbott of the patient were reviewed by me and considered in my medical decision making (see chart for details).     H&P consistent with lumbago, R side.  Given Solu-Medrol injections office, and reiterated importance of follow-up with specialty.  Return precautions discussed, pt verbalized understanding and is agreeable to plan. Final Clinical Impressions(s) / UC Diagnoses   Final diagnoses:  Acute right-sided low back pain without sciatica     Discharge Instructions     Heat therapy (hot compress, warm wash rag, hot showers, etc.) can help relax muscles and soothe muscle aches. Cold therapy (ice packs) can be used to help swelling both after injury and after prolonged use of areas of chronic pain/aches.  Pain medication:  350 mg-1000 mg of Tylenol (acetaminophen) and/or 200 mg - 800 mg of Advil (ibuprofen, Motrin) every 8 hours as needed.  May alternate between the two throughout the day as they are generally safe to take together.  DO NOT exceed more than 3000 mg of Tylenol or 3200 mg of ibuprofen in a 24 hour period as this could damage your stomach, kidneys, liver, or increase your bleeding risk.  Important to follow up with specialist(s) below for further evaluation/management if your symptoms persist or worsen.   ED Prescriptions    Medication Sig Dispense Auth. Provider   naproxen (NAPROSYN) 500 MG tablet Take 1 tablet (500 mg total) by mouth 2 (two) times daily. 30 tablet Hall-Potvin, [place], PA-C     PDMP not reviewed this encounter.   Hall-Potvin, [place],  PA-C 11/20/19 1224    Hall-Potvin, [place], [place] 11/20/19 1224

## 2020-02-17 ENCOUNTER — Ambulatory Visit
Admission: EM | Admit: 2020-02-17 | Discharge: 2020-02-17 | Disposition: A | Discharge status: Home / Self Care | Payer: 59 | Attending: Internal Medicine | Admitting: Internal Medicine

## 2020-02-17 DIAGNOSIS — L0231 Cutaneous abscess of buttock: Secondary | ICD-10-CM | POA: Diagnosis not present

## 2020-02-17 DIAGNOSIS — L03317 Cellulitis of buttock: Secondary | ICD-10-CM

## 2020-02-17 MED ORDER — TRAMADOL HCL 50 MG PO TABS: 50.0000 mg | ORAL_TABLET | Freq: Four times a day (QID) | ORAL | 0 refills | Status: AC | PRN

## 2020-02-17 MED ORDER — DOXYCYCLINE HYCLATE 100 MG PO CAPS: 100.0000 mg | ORAL_CAPSULE | Freq: Two times a day (BID) | ORAL | 0 refills | Status: DC

## 2020-02-17 NOTE — Discharge Instructions (Signed)
Apply heat on abscess for 15-20 minutes 3-5 times a day for 2-3 days or til the abscess drains. If you get worse and does not drain, it will need to be cut open and drained

## 2020-02-17 NOTE — ED Triage Notes (Signed)
Pt c/o abscess to buttocks since Friday. Denies drainage. States painful to sit.

## 2020-02-17 NOTE — ED Provider Notes (Signed)
[name]    CSN: [id number] Arrival date & time: 02/17/20  8242      History   Chief Complaint Chief Complaint  Patient presents with  . Abscess    HPI Tommy Abbott is a 44 y.o. male who presents with an abscess on his L inner buttocks x 4 days. Has had this before but they drain on their own.     History reviewed. No pertinent past medical history.  There are no problems to display for this patient.   History reviewed. No pertinent surgical history.     Home Medications    Prior to Admission medications   Medication Sig Start Date End Date Taking? Authorizing Provider  dicyclomine (BENTYL) 20 MG tablet Take 1 tablet (20 mg total) by mouth 2 (two) times daily. 08/20/19 08/31/19  Dartha Lodge, PA-C  famotidine (PEPCID) 20 MG tablet Take 1 tablet (20 mg total) by mouth 2 (two) times daily. 08/20/19 08/31/19  Dartha Lodge, PA-C  fluticasone (FLONASE) 50 MCG/ACT nasal spray Place 1 spray into both nostrils daily. 01/06/19 02/17/19  Hall-Potvin, [place], PA-C    Family History Family History  Problem Relation Age of Onset  . Diabetes Mother     Social History Social History   Tobacco Use  . Smoking status: Current Every Day Smoker    Packs/day: 0.50  . Smokeless tobacco: Never Used  Substance Use Topics  . Alcohol use: Yes  . Drug use: Yes    Types: Marijuana     Allergies   Patient has no known allergies.   Review of Systems Review of Systems  Constitutional: Negative for chills, diaphoresis and fever.  Skin:       + abscess on buttocks  Hematological: Negative for adenopathy.     Physical Exam Triage Vital Signs ED Triage Vitals [02/17/20 0847]  Enc Vitals Group     BP (!) 146/75     Pulse Rate 78     Resp 18     Temp 98.1 F (36.7 C)     Temp Source Oral     SpO2 98 %     Weight      Height      Head Circumference      Peak Flow      Pain Score 9     Pain Loc      Pain Edu?      Excl. in GC?    No data  found.  Updated Vital Signs BP (!) 146/75 (BP Location: Left Arm)   Pulse 78   Temp 98.1 F (36.7 C) (Oral)   Resp 18   SpO2 98%   Visual Acuity Right Eye Distance:   Left Eye Distance:   Bilateral Distance:    Right Eye Near:   Left Eye Near:    Bilateral Near:     Physical Exam Vitals and nursing note reviewed.  Constitutional:      General: He is not in acute distress.    Appearance: Normal appearance. He is normal weight. He is not ill-appearing, toxic-appearing or diaphoretic.  HENT:     Head: Normocephalic.     Right Ear: External ear normal.     Left Ear: External ear normal.  Eyes:     General: No scleral icterus.    Conjunctiva/sclera: Conjunctivae normal.  Pulmonary:     Effort: Pulmonary effort is normal.  Musculoskeletal:        General: Normal range of motion.  Cervical back: Neck supple.  Skin:    General: Skin is warm and dry.     Findings: No rash.     Comments: L INNER BUTTOCKS- with tender, fluctuant mass of about 3 cm x 2 cm on the lower inner  Buttocks. There is mild erythema in the center.  He declined I&D  Neurological:     Mental Status: He is alert and oriented to person, place, and time.     Gait: Gait normal.  Psychiatric:        Mood and Affect: Mood normal.        Behavior: Behavior normal.        Thought Content: Thought content normal.        Judgment: Judgment normal.      UC Treatments / Results  Labs (all labs ordered are listed, but only abnormal results are displayed) Labs Reviewed - No data to display  EKG   Radiology No results found.  Procedures Procedures (including critical care time)  Medications Ordered in UC Medications - No data to display  Initial Impression / Assessment and Plan / UC Course  I have reviewed the triage vital signs and the nursing notes. Abscess of L buttocks. Pt declined I&D. I placed him on Doxy and tramadol as noted. See instructions.  Final Clinical Impressions(s) / UC  Diagnoses   Final diagnoses:  None   Discharge Instructions   None    ED Prescriptions    None     PDMP not reviewed this encounter.   Garey Ham, [place] 02/17/20 4540

## 2021-02-18 ENCOUNTER — Encounter (HOSPITAL_COMMUNITY): Payer: Self-pay

## 2021-02-18 ENCOUNTER — Emergency Department (HOSPITAL_COMMUNITY): Payer: Self-pay

## 2021-02-18 ENCOUNTER — Emergency Department (HOSPITAL_COMMUNITY)
Admission: EM | Admit: 2021-02-18 | Discharge: 2021-02-18 | Disposition: A | Discharge status: Home / Self Care | Payer: Self-pay | Attending: Emergency Medicine | Admitting: Emergency Medicine

## 2021-02-18 ENCOUNTER — Other Ambulatory Visit: Payer: Self-pay

## 2021-02-18 DIAGNOSIS — M549 Dorsalgia, unspecified: Secondary | ICD-10-CM | POA: Insufficient documentation

## 2021-02-18 DIAGNOSIS — R630 Anorexia: Secondary | ICD-10-CM | POA: Insufficient documentation

## 2021-02-18 DIAGNOSIS — R5383 Other fatigue: Secondary | ICD-10-CM | POA: Insufficient documentation

## 2021-02-18 DIAGNOSIS — R110 Nausea: Secondary | ICD-10-CM | POA: Insufficient documentation

## 2021-02-18 DIAGNOSIS — K921 Melena: Secondary | ICD-10-CM | POA: Insufficient documentation

## 2021-02-18 DIAGNOSIS — F17210 Nicotine dependence, cigarettes, uncomplicated: Secondary | ICD-10-CM | POA: Insufficient documentation

## 2021-02-18 HISTORY — DX: Dorsalgia, unspecified: M54.9

## 2021-02-18 LAB — CBC
HCT: 48.4 % (ref 39.0–52.0)
Hemoglobin: 16.5 g/dL (ref 13.0–17.0)
MCH: 31.6 pg (ref 26.0–34.0)
MCHC: 34.1 g/dL (ref 30.0–36.0)
MCV: 92.7 fL (ref 80.0–100.0)
Platelets: 270 10*3/uL (ref 150–400)
RBC: 5.22 MIL/uL (ref 4.22–5.81)
RDW: 14.4 % (ref 11.5–15.5)
WBC: 5.3 10*3/uL (ref 4.0–10.5)
nRBC: 0 % (ref 0.0–0.2)

## 2021-02-18 LAB — COMPREHENSIVE METABOLIC PANEL
ALT: 19 U/L (ref 0–44)
AST: 20 U/L (ref 15–41)
Albumin: 4.3 g/dL (ref 3.5–5.0)
Alkaline Phosphatase: 57 U/L (ref 38–126)
Anion gap: 7 (ref 5–15)
BUN: 12 mg/dL (ref 6–20)
CO2: 26 mmol/L (ref 22–32)
Calcium: 9.3 mg/dL (ref 8.9–10.3)
Chloride: 103 mmol/L (ref 98–111)
Creatinine, Ser: 1.07 mg/dL (ref 0.61–1.24)
GFR, Estimated: >60 mL/min (ref >60)
Glucose, Bld: 101 mg/dL — ABNORMAL HIGH (ref 70–99)
Potassium: 3.8 mmol/L (ref 3.5–5.1)
Sodium: 136 mmol/L (ref 135–145)
Total Bilirubin: 0.5 mg/dL (ref 0.3–1.2)
Total Protein: 8.3 g/dL — ABNORMAL HIGH (ref 6.5–8.1)

## 2021-02-18 LAB — URINALYSIS, ROUTINE W REFLEX MICROSCOPIC
Bacteria, UA: NONE SEEN
Bilirubin Urine: NEGATIVE
Glucose, UA: NEGATIVE mg/dL
Ketones, ur: NEGATIVE mg/dL
Leukocytes,Ua: NEGATIVE
Nitrite: NEGATIVE
Protein, ur: NEGATIVE mg/dL
Specific Gravity, Urine: 1.02 (ref 1.005–1.030)
pH: 5 (ref 5.0–8.0)

## 2021-02-18 LAB — POC OCCULT BLOOD, ED: Fecal Occult Bld: NEGATIVE

## 2021-02-18 LAB — LIPASE, BLOOD: Lipase: 33 U/L (ref 11–51)

## 2021-02-18 MED ORDER — IOHEXOL 350 MG/ML SOLN
80.0000 mL | Freq: Once | INTRAVENOUS | Status: AC | PRN
  Administered 2021-02-18: 80 mL via INTRAVENOUS

## 2021-02-18 NOTE — Discharge Instructions (Addendum)
As we discussed your work-up today was reassuring, including no evidence of abnormalities in your stomach on the CT scanned, no evidence of abnormalities in your blood work, and a negative test for blood in your stool today.  I am reassured by your lab work, however I am still somewhat concerned by your feeling of increased fatigue, pain with bowel movements, decreased appetite over the last year.  Because of this I recommend that you follow-up with a gastroenterologist whose information I attached at your earliest convenience.  If any of your symptoms worsen or change I recommend that you return to the emergency department for further evaluation if you have not yet seen gastroenterology.  Some of the findings including pain with bowel movements, isolated blood in stool may be explained by internal hemorrhoids however given the increased fatigue, lack of appetite I do still recommend that you follow-up.

## 2021-02-18 NOTE — ED Provider Notes (Signed)
[facility] DEPT Provider Note   CSN: [id number] Arrival date & time: 02/18/21  1019     History Chief Complaint  Patient presents with   Blood In Stools    Tommy Abbott is a 45 y.o. male with no significant past medical history presents with 2 days of bright red blood per rectum.  Patient reports that he has had some chronic back pain, as well as lower abdominal pain for over a year.  Patient reports that he has had increased fatigue, decreased appetite for the last year or 2.  Patient reports pain with bowel movements.  Patient denies history of hemorrhoids.  Patient does reports that he has had a perirectal cyst before.  Patient denies fever, night sweats.  Patient denies family history of colon cancer.  Patient has not had a colonoscopy.  Patient does endorse heavy tobacco use, alcohol use, however he reports he has been trying to cut down on his alcohol use.  Patient does report some diarrhea with his last bowel movement.  Patient also endorses some occasional nausea, without vomiting.  HPI     Past Medical History:  Diagnosis Date   Back pain     There are no problems to display for this patient.   History reviewed. No pertinent surgical history.     Family History  Problem Relation Age of Onset   Diabetes Mother     Social History   Tobacco Use   Smoking status: Every Day    Packs/day: 0.50    Types: Cigarettes   Smokeless tobacco: Never  Vaping Use   Vaping Use: Never used  Substance Use Topics   Alcohol use: Yes   Drug use: Yes    Types: Marijuana    Home Medications Prior to Admission medications   Medication Sig Start Date End Date Taking? Authorizing Provider  doxycycline (VIBRAMYCIN) 100 MG capsule Take 1 capsule (100 mg total) by mouth 2 (two) times daily. 02/17/20   Rodriguez-Southworth, Sunday Spillers, PA-C  traMADol (ULTRAM) 50 MG tablet Take 1 tablet (50 mg total) by mouth every 6 (six) hours as needed. 02/17/20    Rodriguez-Southworth, Sunday Spillers, PA-C  dicyclomine (BENTYL) 20 MG tablet Take 1 tablet (20 mg total) by mouth 2 (two) times daily. 08/20/19 08/31/19  Jacqlyn Larsen, PA-C  famotidine (PEPCID) 20 MG tablet Take 1 tablet (20 mg total) by mouth 2 (two) times daily. 08/20/19 08/31/19  Jacqlyn Larsen, PA-C  fluticasone (FLONASE) 50 MCG/ACT nasal spray Place 1 spray into both nostrils daily. 01/06/19 02/17/19  Hall-Potvin, [place], PA-C    Allergies    Patient has no known allergies.  Review of Systems   Review of Systems  Gastrointestinal:  Positive for blood in stool and diarrhea.  All other systems reviewed and are negative.  Physical Exam Updated Vital Signs BP (!) 140/98   Pulse (!) 57   Temp 98 F (36.7 C) (Oral)   Resp 18   Ht 6\' 5"  (1.956 m)   Wt 90.7 kg   SpO2 100%   BMI 23.72 kg/m   Physical Exam Vitals and nursing note reviewed.  Constitutional:      General: He is not in acute distress.    Appearance: Normal appearance.  HENT:     Head: Normocephalic and atraumatic.  Eyes:     General:        Right eye: No discharge.        Left eye: No discharge.  Cardiovascular:  Rate and Rhythm: Normal rate and regular rhythm.     Heart sounds: No murmur heard.   No friction rub. No gallop.  Pulmonary:     Effort: Pulmonary effort is normal.     Breath sounds: Normal breath sounds.     Comments: Diffuse inspiratory wheezing without respiratory distress, no rhonchi, rales Abdominal:     General: Bowel sounds are normal.     Palpations: Abdomen is soft.     Comments: Minimal tenderness to palpation of the abdomen, no palpable masses, no evidence of intra-abdominal hemorrhage.  No rebound, rigidity, guarding.  Bowel sounds normal throughout.  Skin:    General: Skin is warm and dry.     Capillary Refill: Capillary refill takes less than 2 seconds.  Neurological:     Mental Status: He is alert and oriented to person, place, and time.  Psychiatric:        Mood and Affect: Mood  normal.        Behavior: Behavior normal.    ED Results / Procedures / Treatments   Labs (all labs ordered are listed, but only abnormal results are displayed) Labs Reviewed  COMPREHENSIVE METABOLIC PANEL - Abnormal; Notable for the following components:      Result Value   Glucose, Bld 101 (*)    Total Protein 8.3 (*)    All other components within normal limits  URINALYSIS, ROUTINE W REFLEX MICROSCOPIC - Abnormal; Notable for the following components:   Hgb urine dipstick SMALL (*)    All other components within normal limits  LIPASE, BLOOD  CBC  POC OCCULT BLOOD, ED    EKG None  Radiology CT ABDOMEN PELVIS W CONTRAST  Result Date: 02/18/2021 CLINICAL DATA:  Bloody stool, abdominal pain for several months. EXAM: CT ABDOMEN AND PELVIS WITH CONTRAST TECHNIQUE: Multidetector CT imaging of the abdomen and pelvis was performed using the standard protocol following bolus administration of intravenous contrast. CONTRAST:  27mL OMNIPAQUE IOHEXOL 350 MG/ML SOLN COMPARISON:  None. FINDINGS: Lower chest: No acute abnormality. Hepatobiliary: No focal liver abnormality is seen. No gallstones, gallbladder wall thickening, or biliary dilatation. Pancreas: Unremarkable. No pancreatic ductal dilatation or surrounding inflammatory changes. Spleen: Normal in size without focal abnormality. Adrenals/Urinary Tract: Adrenal glands are unremarkable. Kidneys are normal, without renal calculi, focal lesion, or hydronephrosis. Bladder is unremarkable. Stomach/Bowel: Stomach is within normal limits. Appendix appears normal. No evidence of bowel wall thickening, distention, or inflammatory changes. Vascular/Lymphatic: No significant vascular findings are present. No enlarged abdominal or pelvic lymph nodes. Reproductive: Prostate is unremarkable. Other: No abdominal wall hernia or abnormality. No abdominopelvic ascites. Musculoskeletal: No acute or significant osseous findings. IMPRESSION: No abnormality seen in  the abdomen or pelvis. Electronically Signed   By: Marijo Conception M.D.   On: 02/18/2021 15:05    Procedures Procedures   Medications Ordered in ED Medications  iohexol (OMNIPAQUE) 350 MG/ML injection 80 mL (80 mLs Intravenous Contrast Given 02/18/21 1427)    ED Course  I have reviewed the triage vital signs and the nursing notes.  Pertinent labs & imaging results that were available during my care of the patient were reviewed by me and considered in my medical decision making (see chart for details).    MDM Rules/Calculators/A&P                         I discussed this case with my attending physician who cosigned this note including patient's presenting symptoms, physical exam, and  planned diagnostics and interventions. Attending physician stated agreement with plan or made changes to plan which were implemented.   Clinically overall well-appearing male with some concerning findings of 1+ year of decreased appetite, pain with bowel movements, fatigue as well as new onset blood in stool.  Overall lab work-up today is reassuring, unremarkable CBC, CMP, lipase.  Small hemoglobin in urine without any urinary symptoms, flank pain, dysuria, or gross hematuria.  Patient without any intractable nausea, vomiting, abdominal pain today.  Abdomen nontender on exam today.  Fecal occult blood today is negative.  Given some of the concerning symptoms as discussed above, will discharge patient today in stable condition, however encouraged close follow-up with gastroenterology.  Patient understands suspicion of colon malignancy versus other malignancy.  Some reassurance given that Hemoccult was negative.  Patient stable condition at this time, extensive return precautions given, patient agrees to plan and understands precautions.  Final Clinical Impression(s) / ED Diagnoses Final diagnoses:  Blood in stool    Rx / DC Orders ED Discharge Orders     None        Olene Floss,  PA-C 02/18/21 1601    Alvira Monday, MD 02/19/21 [phone]

## 2021-02-18 NOTE — ED Triage Notes (Signed)
Patient c/o bright red blood on toilet paper after using the bathroom yesterday. Patient states, "I have seen this before but a while back. "I had a pus picket and then it busted. I then saw blood." Patient c/o mid lower abdominal pain and chronic back pain.

## 2022-05-12 IMAGING — CT CT ABD-PELV W/ CM
2 of 5 series · 16 of 46 positions shown, 18 images · IV contrast (omnipaque)
Comparison: None.

CLINICAL DATA: Bloody stool, abdominal pain for several months.

EXAM:
CT ABDOMEN AND PELVIS WITH CONTRAST
TECHNIQUE: Multidetector CT imaging of the abdomen and pelvis was performed
using the standard protocol following bolus administration of
intravenous contrast.
CONTRAST:  80mL OMNIPAQUE IOHEXOL 350 MG/ML SOLN

[Series 2: axial st · axial · 0.98mm/px · z∈[-272,+158]mm · 13 of 102 slices shown, 15 images]
[im 8/102  soft-tissue]
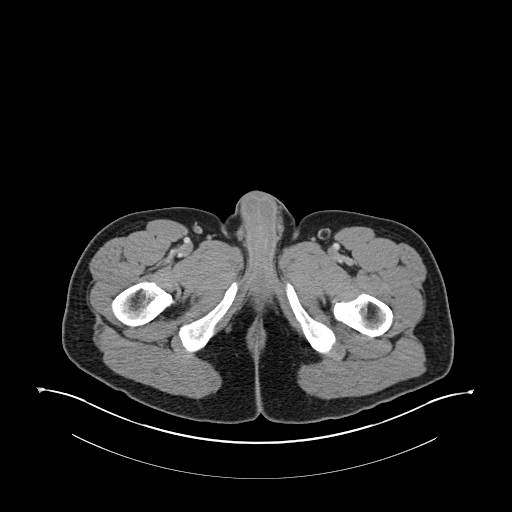
[im 8/102  bone]
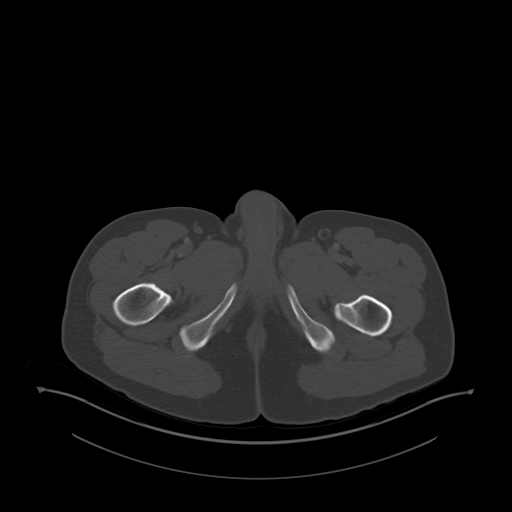
[im 15/102  soft-tissue]
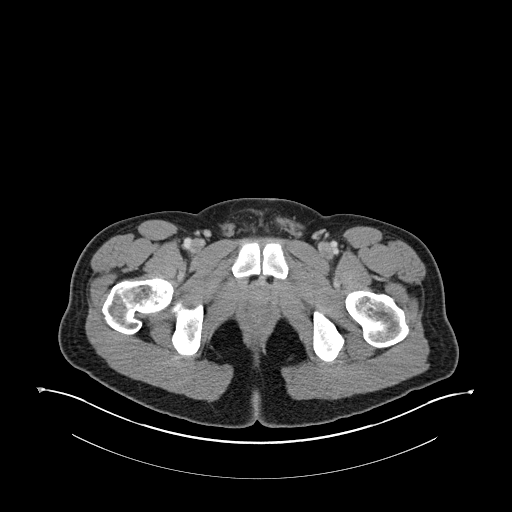
[im 22/102  soft-tissue]
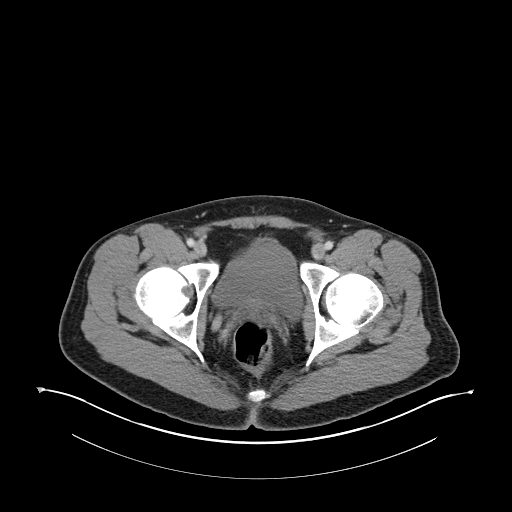
[im 29/102  soft-tissue]
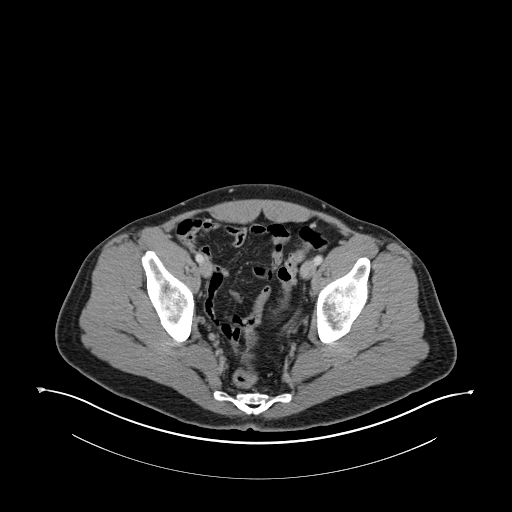
[im 37/102  soft-tissue]
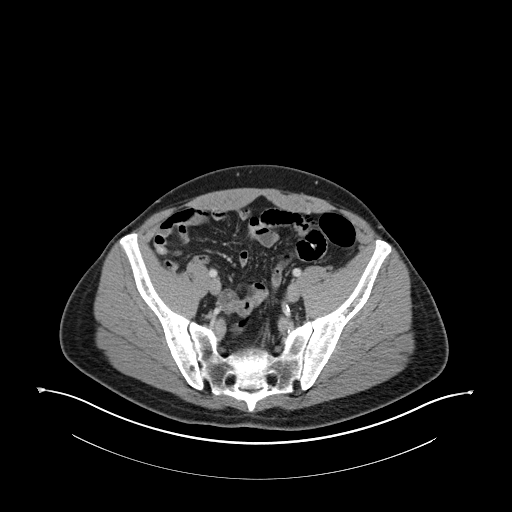
[im 44/102  soft-tissue]
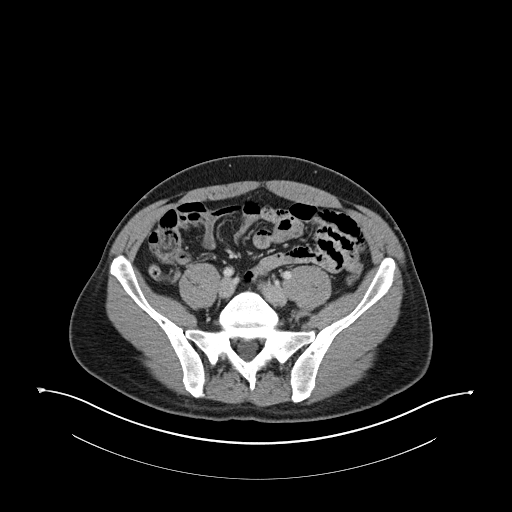
[im 51/102  soft-tissue]
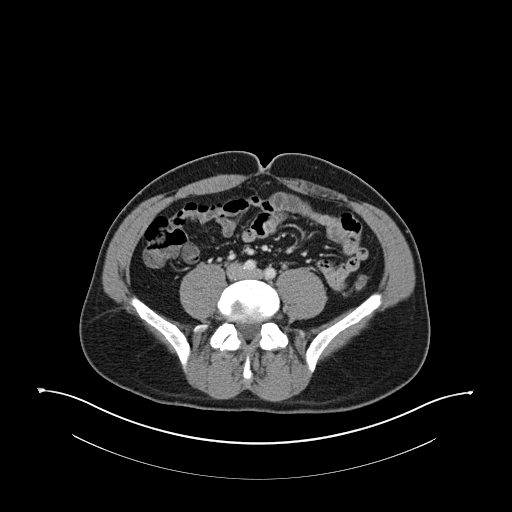
[im 58/102  soft-tissue]
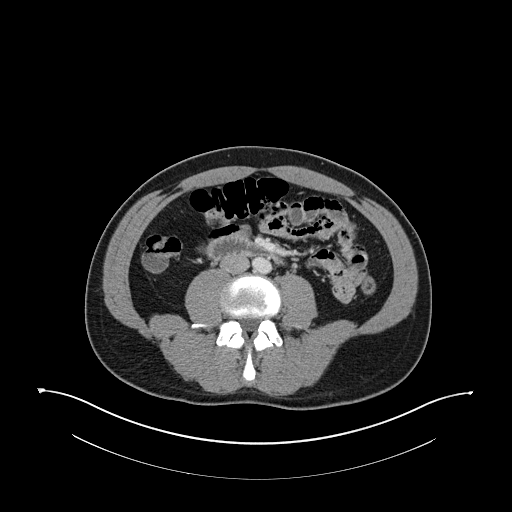
[im 65/102  soft-tissue]
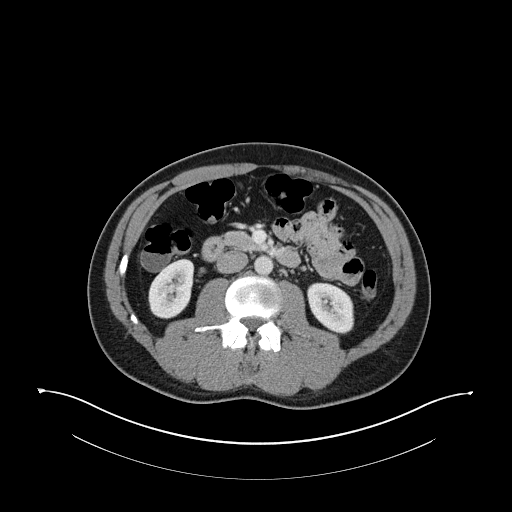
[im 65/102  bone]
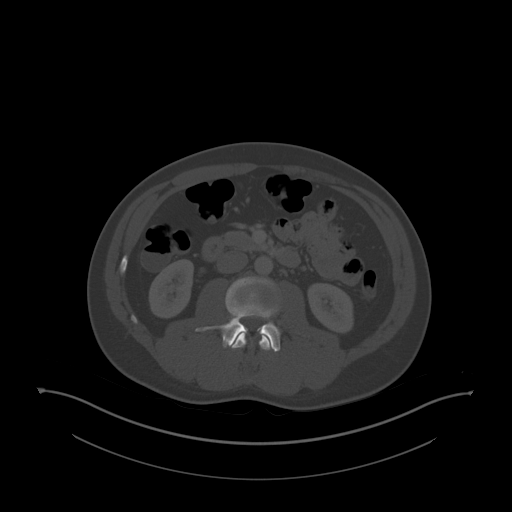
[im 73/102  soft-tissue]
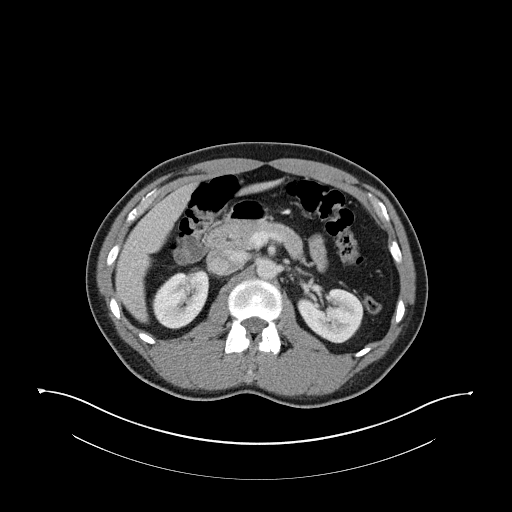
[im 80/102  soft-tissue]
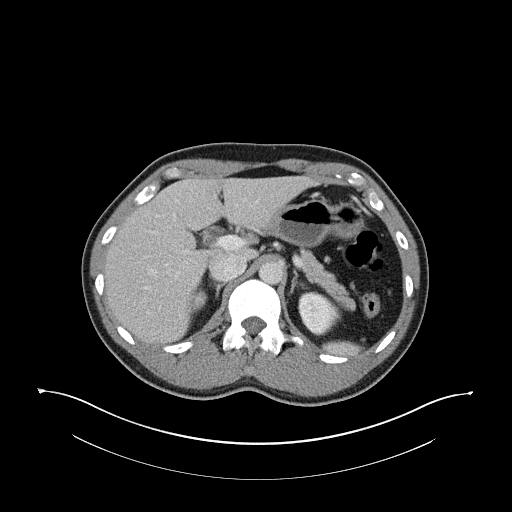
[im 87/102  soft-tissue]
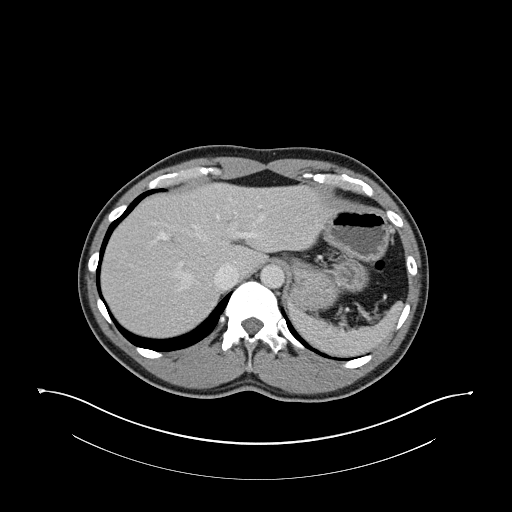
[im 94/102  soft-tissue]
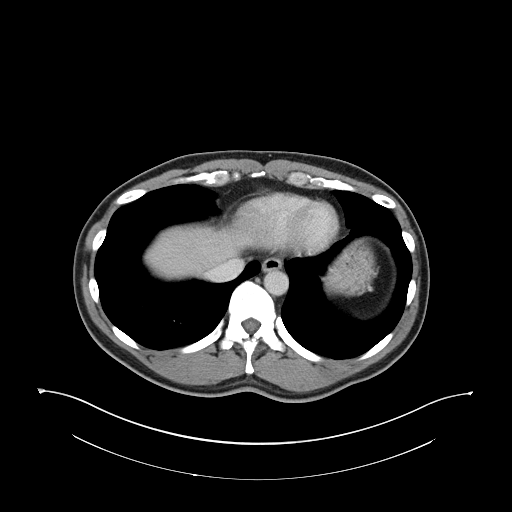

[Series 5: coronal st · coronal · 0.96mm/px · 3 of 157 slices shown]
[im 53/157  soft-tissue]
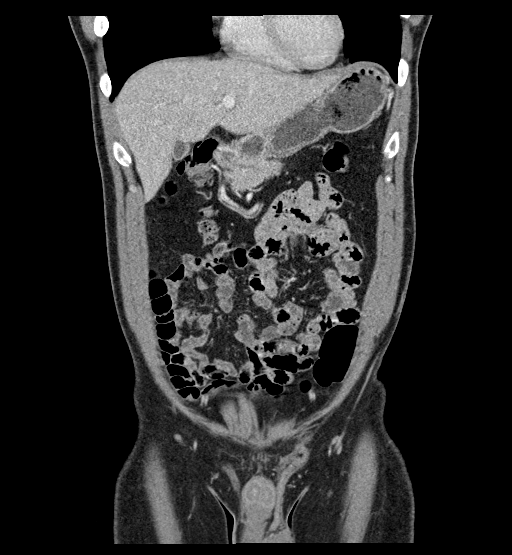
[im 70/157  soft-tissue]
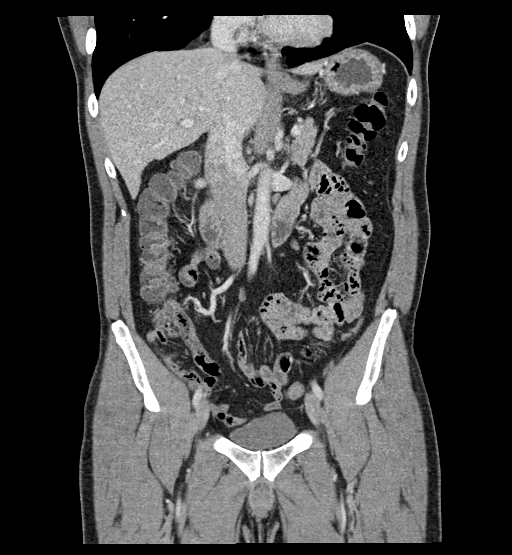
[im 87/157  soft-tissue]
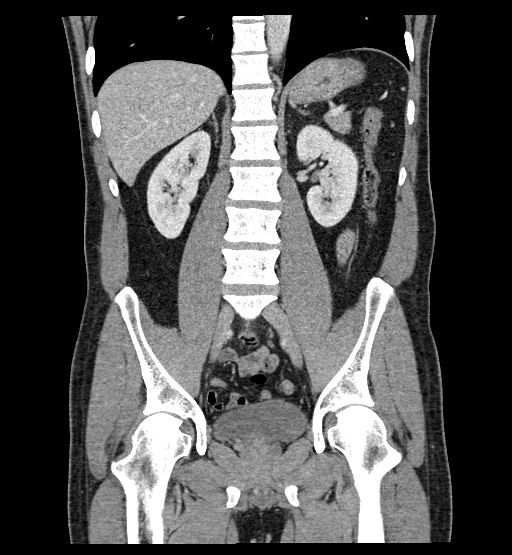

[16 of 46 positions shown; findings below may reference images not displayed]

FINDINGS: Lower chest: No acute abnormality.

Hepatobiliary: No focal liver abnormality is seen. No gallstones,
gallbladder wall thickening, or biliary dilatation.

Pancreas: Unremarkable. No pancreatic ductal dilatation or
surrounding inflammatory changes.

Spleen: Normal in size without focal abnormality.

Adrenals/Urinary Tract: Adrenal glands are unremarkable. Kidneys are
normal, without renal calculi, focal lesion, or hydronephrosis.
Bladder is unremarkable.

Stomach/Bowel: Stomach is within normal limits. Appendix appears
normal. No evidence of bowel wall thickening, distention, or
inflammatory changes.

Vascular/Lymphatic: No significant vascular findings are present. No
enlarged abdominal or pelvic lymph nodes.

Reproductive: Prostate is unremarkable.

Other: No abdominal wall hernia or abnormality. No abdominopelvic
ascites.

Musculoskeletal: No acute or significant osseous findings.
IMPRESSION: No abnormality seen in the abdomen or pelvis.

## 2022-05-25 ENCOUNTER — Emergency Department (HOSPITAL_COMMUNITY)
Admission: EM | Admit: 2022-05-25 | Discharge: 2022-05-25 | Disposition: A | Discharge status: Home / Self Care | Payer: Self-pay | Attending: Emergency Medicine | Admitting: Emergency Medicine

## 2022-05-25 ENCOUNTER — Other Ambulatory Visit: Payer: Self-pay

## 2022-05-25 DIAGNOSIS — L03317 Cellulitis of buttock: Secondary | ICD-10-CM | POA: Insufficient documentation

## 2022-05-25 MED ORDER — CEPHALEXIN 500 MG PO CAPS: 500.0000 mg | ORAL_CAPSULE | Freq: Four times a day (QID) | ORAL | 0 refills | Status: AC

## 2022-05-25 MED ORDER — KETOROLAC TROMETHAMINE 60 MG/2ML IM SOLN
60.0000 mg | Freq: Once | INTRAMUSCULAR | Status: AC
  Administered 2022-05-25: 60 mg via INTRAMUSCULAR
  Filled 2022-05-25: qty 2

## 2022-05-25 NOTE — ED Triage Notes (Signed)
Pt c/o buttocks/rectal pain that started last Friday. Endorses concern for hemorrhoids and constipation, last BM Monday. Denies rectal bleeding. No injury.

## 2022-05-25 NOTE — Discharge Instructions (Signed)
You have been seen today for your complaint of rectal pain. Your discharge medications include Alternate tylenol and ibuprofen for pain. You may alternate these every 4 hours. You may take up to 800 mg of ibuprofen at a time and up to 1000 mg of tylenol. Keflex. This is an antibiotic. You should take it as prescribed. You should take it for the entire duration of the prescription. This may cause an upset stomach. This is normal. You may take this with food. You may also eat yogurt to prevent diarrhea.  Miralax and magnesium citrate. These are laxatives used to help you have a bowel movement. Follow instructions on the bottle. Follow up with: a primary care provider in one week for reevaluation Please seek immediate medical care if you develop any of the following symptoms: Your symptoms get worse. You feel very sleepy. You develop vomiting or diarrhea that persists. You notice red streaks coming from the infected area. Your red area gets larger or turns dark in color. At this time there does not appear to be the presence of an emergent medical condition, however there is always the potential for conditions to change. Please read and follow the below instructions.  Do not take your medicine if  develop an itchy rash, swelling in your mouth or lips, or difficulty breathing; call 911 and seek immediate emergency medical attention if this occurs.  You may review your lab tests and imaging results in their entirety on your MyChart account.  Please discuss all results of fully with your primary care provider and other specialist at your follow-up visit.  Note: Portions of this text may have been transcribed using voice recognition software. Every effort was made to ensure accuracy; however, inadvertent computerized transcription errors may still be present.

## 2022-05-25 NOTE — ED Notes (Signed)
Unable to visualized area of concern in triage. Pt provided with hospital gown and asked to undress from the waist down. Family member at bedside.

## 2022-05-25 NOTE — ED Provider Notes (Signed)
[facility] [facility] ([place]) Provider Note   CSN: [id number] Arrival date & time: 05/25/22  [id number]     History  Chief Complaint  Patient presents with   Rectal Pain    Tommy Abbott is a 47 y.o. male.  With history of hemorrhoids who presents to the ED for evaluation of buttock pain.  Symptoms began 6 days ago and have progressively gotten worse.  States it is painful to sit down.  Describes the pain as a burning and itching sensation.  States it feels similar to when he has had hemorrhoids in the past.  He has been unable to have a bowel movement since Monday which she reports is due to the pain.  Currently rates pain at a 10 out of 10.  His last bowel movement was described as normal.  Denies abdominal pain, fevers, chills, urinary symptoms.  He has not tried anything for symptoms at home.  HPI     Home Medications Prior to Admission medications   Medication Sig Start Date End Date Taking? Authorizing Provider  cephALEXin (KEFLEX) 500 MG capsule Take 1 capsule (500 mg total) by mouth 4 (four) times daily for 7 days. 05/25/22 06/01/22 Yes Jalyn Dutta, [place] Folk, PA-C  doxycycline (VIBRAMYCIN) 100 MG capsule Take 1 capsule (100 mg total) by mouth 2 (two) times daily. 02/17/20   Rodriguez-Southworth, Sunday Spillers, PA-C  traMADol (ULTRAM) 50 MG tablet Take 1 tablet (50 mg total) by mouth every 6 (six) hours as needed. 02/17/20   Rodriguez-Southworth, Sunday Spillers, PA-C  dicyclomine (BENTYL) 20 MG tablet Take 1 tablet (20 mg total) by mouth 2 (two) times daily. 08/20/19 08/31/19  Jacqlyn Larsen, PA-C  famotidine (PEPCID) 20 MG tablet Take 1 tablet (20 mg total) by mouth 2 (two) times daily. 08/20/19 08/31/19  Jacqlyn Larsen, PA-C  fluticasone (FLONASE) 50 MCG/ACT nasal spray Place 1 spray into both nostrils daily. 01/06/19 02/17/19  Hall-Potvin, [place], PA-C      Allergies    Patient has no known allergies.    Review of Systems   Review of Systems  Gastrointestinal:  Positive  for rectal pain.  All other systems reviewed and are negative.   Physical Exam Updated Vital Signs BP 117/69   Pulse 65   Temp 97.8 F (36.6 C) (Oral)   Resp 18   SpO2 99%  Physical Exam Vitals and nursing note reviewed. Exam conducted with a chaperone present Thedore Mins, Therapist, sports).  Constitutional:      General: He is not in acute distress.    Appearance: Normal appearance. He is normal weight. He is not ill-appearing.  HENT:     Head: Normocephalic and atraumatic.  Pulmonary:     Effort: Pulmonary effort is normal. No respiratory distress.  Abdominal:     General: Abdomen is flat.  Genitourinary:    Comments: Small non thrombosed external hemorrhoid. No rectal masses Musculoskeletal:        General: Normal range of motion.     Cervical back: Neck supple.  Skin:    General: Skin is warm and dry.          Comments: 2 cm by 3 cm area of erythema and induration to the superior portion of the gluteal cleft without fluctuance or drainage.  Neurological:     Mental Status: He is alert and oriented to person, place, and time.  Psychiatric:        Mood and Affect: Mood normal.        Behavior: Behavior  normal.     ED Results / Procedures / Treatments   Labs (all labs ordered are listed, but only abnormal results are displayed) Labs Reviewed - No data to display  EKG None  Radiology No results found.  Procedures Procedures    Medications Ordered in ED Medications  ketorolac (TORADOL) injection 60 mg (60 mg Intramuscular Given 05/25/22 [id number])    ED Course/ Medical Decision Making/ A&P                             Medical Decision Making Risk Prescription drug management.  This patient presents to the ED for concern of buttock pain, this involves an extensive number of treatment options, and is a complaint that carries with it a high risk of complications and morbidity.  The differential diagnosis includes cellulitis, abscess, pilonidal cyst  Additional history obtained  from: Nursing notes from this visit. Previous records within EMR system similar presentation on 02/17/2020  Afebrile, hemodynamically stable.  47 year old male presenting to the ED for evaluation of buttock pain.  On exam, he has a 2 cm x 3 cm area of erythema and induration to the superior portion of the gluteal cleft with no fluctuance.  Rectal exam unremarkable.  Patient will be treated for cellulitis with Keflex.  He does not have a primary care provider.  Was strongly encouraged to obtain one and be reevaluated in 1 week.  He was educated on the use of ibuprofen and Tylenol for his pain.  He was encouraged to use stool softeners in order to have a normal bowel movement.  He was given return precautions.  Stable at discharge.  At this time there does not appear to be any evidence of an acute emergency medical condition and the patient appears stable for discharge with appropriate outpatient follow up. Diagnosis was discussed with patient who verbalizes understanding of care plan and is agreeable to discharge. I have discussed return precautions with patient and wife who verbalizes understanding. Patient encouraged to follow-up with their PCP within 1 week. All questions answered.  Note: Portions of this report may have been transcribed using voice recognition software. Every effort was made to ensure accuracy; however, inadvertent computerized transcription errors may still be present.         Final Clinical Impression(s) / ED Diagnoses Final diagnoses:  Cellulitis of buttock    Rx / DC Orders ED Discharge Orders          Ordered    cephALEXin (KEFLEX) 500 MG capsule  4 times daily        05/25/22 0810              Roylene Reason, PA-C 05/25/22 [id number]    Audley Hose, MD 05/25/22 [phone]

## 2022-11-24 ENCOUNTER — Ambulatory Visit
Admission: EM | Admit: 2022-11-24 | Discharge: 2022-11-24 | Disposition: A | Discharge status: Home / Self Care | Payer: Self-pay | Attending: Internal Medicine | Admitting: Internal Medicine

## 2022-11-24 ENCOUNTER — Other Ambulatory Visit: Payer: Self-pay

## 2022-11-24 ENCOUNTER — Encounter: Payer: Self-pay | Admitting: Emergency Medicine

## 2022-11-24 DIAGNOSIS — L0201 Cutaneous abscess of face: Secondary | ICD-10-CM

## 2022-11-24 MED ORDER — DOXYCYCLINE HYCLATE 100 MG PO CAPS: 100.0000 mg | ORAL_CAPSULE | Freq: Two times a day (BID) | ORAL | 0 refills | Status: AC

## 2022-11-24 NOTE — Discharge Instructions (Signed)
I have sent you an antibiotic to treat abscess of the face.  Follow-up if any symptoms persist or worsen.

## 2022-11-24 NOTE — ED Triage Notes (Signed)
Pt here for pimple to face on left side near eye that is now more swollen and swelling in moving under left eye x 3 days

## 2022-11-24 NOTE — ED Provider Notes (Signed)
[name]    CSN: [id number] Arrival date & time: 11/24/22  1133      History   Chief Complaint Chief Complaint  Patient presents with   Facial Swelling    HPI Tommy Abbott is a 47 y.o. male.   Patient presents with area of swelling to left side of face directly below left eye that started about 3 days ago.  Reports that it started off as a pimple-like lesion.  Denies any purulent drainage or any fever.  Reports that this has happened in the past.     Past Medical History:  Diagnosis Date   Back pain     There are no problems to display for this patient.   History reviewed. No pertinent surgical history.     Home Medications    Prior to Admission medications   Medication Sig Start Date End Date Taking? Authorizing Provider  doxycycline (VIBRAMYCIN) 100 MG capsule Take 1 capsule (100 mg total) by mouth 2 (two) times daily. 11/24/22  Yes Rhylan Gross, Acie Fredrickson, FNP  traMADol (ULTRAM) 50 MG tablet Take 1 tablet (50 mg total) by mouth every 6 (six) hours as needed. Patient not taking: Reported on 11/24/2022 02/17/20   Rodriguez-Southworth, Nettie Elm, PA-C  dicyclomine (BENTYL) 20 MG tablet Take 1 tablet (20 mg total) by mouth 2 (two) times daily. 08/20/19 08/31/19  Dartha Lodge, PA-C  famotidine (PEPCID) 20 MG tablet Take 1 tablet (20 mg total) by mouth 2 (two) times daily. 08/20/19 08/31/19  Dartha Lodge, PA-C  fluticasone (FLONASE) 50 MCG/ACT nasal spray Place 1 spray into both nostrils daily. 01/06/19 02/17/19  Hall-Potvin, [place], PA-C    Family History Family History  Problem Relation Age of Onset   Diabetes Mother     Social History Social History   Tobacco Use   Smoking status: Every Day    Current packs/day: 0.50    Types: Cigarettes   Smokeless tobacco: Never  Vaping Use   Vaping status: Never Used  Substance Use Topics   Alcohol use: Yes   Drug use: Yes    Types: Marijuana     Allergies   Patient has no known allergies.   Review of  Systems Review of Systems Per HPI  Physical Exam Triage Vital Signs ED Triage Vitals [11/24/22 1250]  Encounter Vitals Group     BP (!) 146/91     Systolic BP Percentile      Diastolic BP Percentile      Pulse Rate 80     Resp 18     Temp 98.3 F (36.8 C)     Temp Source Oral     SpO2 98 %     Weight      Height      Head Circumference      Peak Flow      Pain Score 4     Pain Loc      Pain Education      Exclude from Growth Chart    No data found.  Updated Vital Signs BP (!) 146/91 (BP Location: Left Arm)   Pulse 80   Temp 98.3 F (36.8 C) (Oral)   Resp 18   SpO2 98%   Visual Acuity Right Eye Distance:   Left Eye Distance:   Bilateral Distance:    Right Eye Near:   Left Eye Near:    Bilateral Near:     Physical Exam Constitutional:      General: He is not in acute  distress.    Appearance: Normal appearance. He is not toxic-appearing or diaphoretic.  HENT:     Head: Normocephalic and atraumatic.      Comments: Patient has approximately 2 cm area of induration with no discoloration present to left cheek.  Swelling seems to slightly extend underneath left lower eye.  No purulent drainage noted. Eyes:     Extraocular Movements: Extraocular movements intact.     Conjunctiva/sclera: Conjunctivae normal.  Pulmonary:     Effort: Pulmonary effort is normal.  Neurological:     General: No focal deficit present.     Mental Status: He is alert and oriented to person, place, and time. Mental status is at baseline.  Psychiatric:        Mood and Affect: Mood normal.        Behavior: Behavior normal.        Thought Content: Thought content normal.        Judgment: Judgment normal.      UC Treatments / Results  Labs (all labs ordered are listed, but only abnormal results are displayed) Labs Reviewed - No data to display  EKG   Radiology No results found.  Procedures Procedures (including critical care time)  Medications Ordered in UC Medications -  No data to display  Initial Impression / Assessment and Plan / UC Course  I have reviewed the triage vital signs and the nursing notes.  Pertinent labs & imaging results that were available during my care of the patient were reviewed by me and considered in my medical decision making (see chart for details).     Patient has abscess to left side of face.  No I&D indicated at this time.  Will treat with doxycycline.  Patient advised of using warm compresses and following up if symptoms persist or worsen.  Patient verbalized understanding and was agreeable with plan. Final Clinical Impressions(s) / UC Diagnoses   Final diagnoses:  Abscess of face     Discharge Instructions      I have sent you an antibiotic to treat abscess of the face.  Follow-up if any symptoms persist or worsen.    ED Prescriptions     Medication Sig Dispense Auth. Provider   doxycycline (VIBRAMYCIN) 100 MG capsule Take 1 capsule (100 mg total) by mouth 2 (two) times daily. 20 capsule Gustavus Bryant, [place]      PDMP not reviewed this encounter.   Gustavus Bryant, [place] 11/24/22 [phone]

## 2023-09-13 DIAGNOSIS — Z419 Encounter for procedure for purposes other than remedying health state, unspecified: Secondary | ICD-10-CM | POA: Diagnosis not present

## 2023-10-13 DIAGNOSIS — Z419 Encounter for procedure for purposes other than remedying health state, unspecified: Secondary | ICD-10-CM | POA: Diagnosis not present

## 2023-11-13 DIAGNOSIS — Z419 Encounter for procedure for purposes other than remedying health state, unspecified: Secondary | ICD-10-CM | POA: Diagnosis not present

## 2023-11-17 ENCOUNTER — Ambulatory Visit: Admission: EM | Admit: 2023-11-17 | Discharge: 2023-11-17 | Disposition: A | Discharge status: Home / Self Care

## 2023-11-17 DIAGNOSIS — L03213 Periorbital cellulitis: Secondary | ICD-10-CM

## 2023-11-17 MED ORDER — AZELASTINE HCL 0.05 % OP SOLN: 1.0000 [drp] | Freq: Two times a day (BID) | OPHTHALMIC | 3 refills | Status: AC

## 2023-11-17 NOTE — ED Triage Notes (Signed)
 I have a stye or something in my right eye. No injury.

## 2023-11-17 NOTE — ED Provider Notes (Signed)
 [facility]  Note:  This document was prepared using Conservation officer, historic buildings and may include unintentional dictation errors.  MRN: [id number] DOB: 09-Sep-1975  Subjective:   Tommy Abbott is a 48 y.o. male presenting for right upper eyelid swelling and discomfort since Thursday morning.  Patient denies any vision changes, severe eye pain, pain with eye movement, or purulent drainage from the eye.  Patient denies any known injury or trauma to the eye.  Patient reports that eyelid is slightly itchy but not painful.  No current facility-administered medications for this encounter.  Current Outpatient Medications:    azelastine  (OPTIVAR ) 0.05 % ophthalmic solution, Place 1 drop into the right eye 2 (two) times daily., Disp: 6 mL, Rfl: 3   hydrocortisone (ANUSOL-HC) 25 MG suppository, Place 1 suppository rectally 2 (two) times daily., Disp: , Rfl:    doxycycline  (VIBRAMYCIN ) 100 MG capsule, Take 1 capsule (100 mg total) by mouth 2 (two) times daily., Disp: 20 capsule, Rfl: 0   traMADol  (ULTRAM ) 50 MG tablet, Take 1 tablet (50 mg total) by mouth every 6 (six) hours as needed. (Patient not taking: Reported on 11/24/2022), Disp: 15 tablet, Rfl: 0   No Known Allergies  Past Medical History:  Diagnosis Date   Back pain      History reviewed. No pertinent surgical history.  Family History  Problem Relation Age of Onset   Diabetes Mother     Social History   Tobacco Use   Smoking status: Every Day    Current packs/day: 0.50    Types: Cigarettes   Smokeless tobacco: Never  Vaping Use   Vaping status: Never Used  Substance Use Topics   Alcohol use: Yes    Comment: Occassionally.   Drug use: Yes    Types: Marijuana    ROS Refer to HPI for ROS details.  Objective:   Vitals: BP 130/83 (BP Location: Right Arm)   Pulse 68   Temp 97.9 F (36.6 C) (Oral)   Resp 16   Ht 6' 5 (1.956 m)   Wt 210 lb (95.3 kg)   SpO2 97%   BMI 24.90 kg/m   Physical  Exam Vitals and nursing note reviewed.  Constitutional:      General: He is not in acute distress.    Appearance: Normal appearance. He is well-developed. He is not ill-appearing or toxic-appearing.  HENT:     Head: Normocephalic.  Eyes:     General: Lids are everted, no foreign bodies appreciated. Vision grossly intact. Gaze aligned appropriately. No allergic shiner or visual field deficit.       Right eye: No foreign body, discharge or hordeolum.        Left eye: No foreign body, discharge or hordeolum.     Extraocular Movements:     Right eye: Normal extraocular motion.     Left eye: Normal extraocular motion.     Conjunctiva/sclera:     Right eye: Right conjunctiva is not injected. No exudate.    Left eye: Left conjunctiva is not injected. No exudate.  Cardiovascular:     Rate and Rhythm: Normal rate.  Pulmonary:     Effort: Pulmonary effort is normal. No respiratory distress.  Skin:    General: Skin is warm and dry.  Neurological:     General: No focal deficit present.     Mental Status: He is alert and oriented to person, place, and time.  Psychiatric:        Mood and Affect: Mood normal.  Behavior: Behavior normal.     Procedures  No results found for this or any previous visit (from the past 24 hours).  No results found.   Assessment and Plan :     Discharge Instructions       1. Preseptal cellulitis of right upper eyelid (Primary) - Visual acuity screening completed in UC is within normal limits, no vision changes or discrepancy. - azelastine  (OPTIVAR ) 0.05 % ophthalmic solution; Place 1 drop into the right eye 2 (two) times daily.  Dispense: 6 mL; Refill: 3 - Use cool compress over right thigh to decrease inflammation and discomfort. - Continue to monitor symptoms for any change in severity if there is any escalation of current symptoms or development of new symptoms follow-up for further evaluation management in ER or with eye care  specialist.       [name]   [name], NP 11/17/23 1009

## 2023-11-17 NOTE — Discharge Instructions (Addendum)
  1. Preseptal cellulitis of right upper eyelid (Primary) - Visual acuity screening completed in UC is within normal limits, no vision changes or discrepancy. - azelastine  (OPTIVAR ) 0.05 % ophthalmic solution; Place 1 drop into the right eye 2 (two) times daily.  Dispense: 6 mL; Refill: 3 - Use cool compress over right eye to decrease inflammation and discomfort. - Continue to monitor symptoms for any change in severity if there is any escalation of current symptoms or development of new symptoms follow-up for further evaluation management in ER or with eye care specialist.

## 2023-12-14 DIAGNOSIS — Z419 Encounter for procedure for purposes other than remedying health state, unspecified: Secondary | ICD-10-CM | POA: Diagnosis not present

## 2024-01-13 DIAGNOSIS — Z419 Encounter for procedure for purposes other than remedying health state, unspecified: Secondary | ICD-10-CM | POA: Diagnosis not present
